# Patient Record
Sex: Male | Born: 1940 | Race: White | Hispanic: No | Marital: Married | ZIP: L1C | Smoking: Never smoker
Health system: Southern US, Community
[De-identification: ages and names within clinical notes are randomized; demographics above are authoritative.]

## PROBLEM LIST (undated history)

## (undated) DIAGNOSIS — G4730 Sleep apnea, unspecified: Secondary | ICD-10-CM

## (undated) DIAGNOSIS — I700 Atherosclerosis of aorta: Secondary | ICD-10-CM

## (undated) DIAGNOSIS — J8410 Pulmonary fibrosis, unspecified: Secondary | ICD-10-CM

## (undated) DIAGNOSIS — K219 Gastro-esophageal reflux disease without esophagitis: Secondary | ICD-10-CM

## (undated) DIAGNOSIS — I639 Cerebral infarction, unspecified: Secondary | ICD-10-CM

## (undated) DIAGNOSIS — I10 Essential (primary) hypertension: Secondary | ICD-10-CM

## (undated) DIAGNOSIS — M109 Gout, unspecified: Secondary | ICD-10-CM

## (undated) HISTORY — DX: Cerebral infarction, unspecified: I63.9

## (undated) HISTORY — DX: Atherosclerosis of aorta: I70.0

## (undated) HISTORY — PX: HERNIA REPAIR: SHX51

---

## 2003-12-09 ENCOUNTER — Ambulatory Visit (HOSPITAL_COMMUNITY): Admission: RE | Admit: 2003-12-09 | Discharge: 2003-12-09 | Payer: Self-pay | Admitting: Internal Medicine

## 2007-01-02 ENCOUNTER — Encounter: Admission: RE | Admit: 2007-01-02 | Discharge: 2007-01-02 | Payer: Self-pay | Admitting: Internal Medicine

## 2007-07-01 ENCOUNTER — Encounter: Admission: RE | Admit: 2007-07-01 | Discharge: 2007-07-01 | Payer: Self-pay | Admitting: Gastroenterology

## 2011-09-17 DIAGNOSIS — G4730 Sleep apnea, unspecified: Secondary | ICD-10-CM

## 2011-09-17 HISTORY — DX: Sleep apnea, unspecified: G47.30

## 2012-07-22 ENCOUNTER — Ambulatory Visit (HOSPITAL_COMMUNITY)
Admission: RE | Admit: 2012-07-22 | Discharge: 2012-07-22 | Disposition: A | Discharge status: Home / Self Care | Payer: Medicare Other | Source: Ambulatory Visit | Attending: Allergy and Immunology | Admitting: Allergy and Immunology

## 2012-07-22 DIAGNOSIS — I10 Essential (primary) hypertension: Secondary | ICD-10-CM | POA: Insufficient documentation

## 2012-07-22 DIAGNOSIS — I359 Nonrheumatic aortic valve disorder, unspecified: Secondary | ICD-10-CM

## 2012-07-22 DIAGNOSIS — R011 Cardiac murmur, unspecified: Secondary | ICD-10-CM | POA: Insufficient documentation

## 2012-07-22 NOTE — Progress Notes (Signed)
  Echocardiogram 2D Echocardiogram has been performed.  Thomas Bond 07/22/2012, 10:10 AM

## 2013-08-17 ENCOUNTER — Ambulatory Visit
Admission: RE | Admit: 2013-08-17 | Discharge: 2013-08-17 | Disposition: A | Discharge status: Home / Self Care | Payer: Medicare Other | Source: Ambulatory Visit | Attending: Allergy and Immunology | Admitting: Allergy and Immunology

## 2013-08-17 ENCOUNTER — Other Ambulatory Visit (HOSPITAL_COMMUNITY): Payer: Self-pay | Admitting: Allergy and Immunology

## 2013-08-17 ENCOUNTER — Other Ambulatory Visit: Payer: Self-pay | Admitting: Allergy and Immunology

## 2013-08-17 DIAGNOSIS — R05 Cough: Secondary | ICD-10-CM

## 2013-08-17 DIAGNOSIS — I359 Nonrheumatic aortic valve disorder, unspecified: Secondary | ICD-10-CM

## 2013-08-18 ENCOUNTER — Ambulatory Visit (HOSPITAL_COMMUNITY)
Admission: RE | Admit: 2013-08-18 | Discharge: 2013-08-18 | Disposition: A | Discharge status: Home / Self Care | Payer: Medicare Other | Source: Ambulatory Visit | Attending: Allergy and Immunology | Admitting: Allergy and Immunology

## 2013-08-18 DIAGNOSIS — I359 Nonrheumatic aortic valve disorder, unspecified: Secondary | ICD-10-CM | POA: Insufficient documentation

## 2013-08-18 NOTE — Progress Notes (Signed)
  Echocardiogram 2D Echocardiogram has been performed.  Thomas Bond FRANCES 08/18/2013, 4:02 PM

## 2014-08-18 ENCOUNTER — Other Ambulatory Visit: Payer: Self-pay | Admitting: Gastroenterology

## 2014-08-18 ENCOUNTER — Other Ambulatory Visit (HOSPITAL_COMMUNITY): Payer: Self-pay | Admitting: Gastroenterology

## 2014-08-18 DIAGNOSIS — J690 Pneumonitis due to inhalation of food and vomit: Secondary | ICD-10-CM

## 2014-09-26 ENCOUNTER — Encounter (HOSPITAL_COMMUNITY): Payer: Self-pay | Admitting: *Deleted

## 2014-09-27 ENCOUNTER — Other Ambulatory Visit: Payer: Self-pay | Admitting: Gastroenterology

## 2014-10-08 NOTE — Anesthesia Preprocedure Evaluation (Addendum)
Anesthesia Evaluation  Patient identified by MRN, date of birth, ID band Patient awake    Reviewed: Allergy & Precautions, NPO status , Patient's Chart, lab work & pertinent test results, reviewed documented beta blocker date and time   Airway Mallampati: II   Neck ROM: Full    Dental  (+) Teeth Intact, Dental Advisory Given   Pulmonary sleep apnea ,  breath sounds clear to auscultation        Cardiovascular hypertension, Pt. on medications Rhythm:Regular  2014 ECHO EF 65% Aortic valve 1.2 cm gradient 24mmhg   Neuro/Psych negative neurological ROS  negative psych ROS   GI/Hepatic Neg liver ROS, GERD-  Medicated,Hx colon polyp   Endo/Other  negative endocrine ROS  Renal/GU negative Renal ROS     Musculoskeletal   Abdominal (+)  Abdomen: soft.    Peds  Hematology negative hematology ROS (+)   Anesthesia Other Findings   Reproductive/Obstetrics                            Anesthesia Physical Anesthesia Plan  ASA: II  Anesthesia Plan: MAC   Post-op Pain Management:    Induction: Intravenous  Airway Management Planned: Nasal Cannula  Additional Equipment:   Intra-op Plan:   Post-operative Plan:   Informed Consent: I have reviewed the patients History and Physical, chart, labs and discussed the procedure including the risks, benefits and alternatives for the proposed anesthesia with the patient or authorized representative who has indicated his/her understanding and acceptance.     Plan Discussed with:   Anesthesia Plan Comments:         Anesthesia Quick Evaluation

## 2014-10-10 ENCOUNTER — Encounter (HOSPITAL_COMMUNITY)
Admission: RE | Disposition: A | Discharge status: Home / Self Care | Payer: Self-pay | Source: Ambulatory Visit | Attending: Gastroenterology

## 2014-10-10 ENCOUNTER — Ambulatory Visit (HOSPITAL_COMMUNITY): Payer: Commercial Managed Care - HMO | Admitting: Anesthesiology

## 2014-10-10 ENCOUNTER — Encounter (HOSPITAL_COMMUNITY): Payer: Self-pay | Admitting: Anesthesiology

## 2014-10-10 ENCOUNTER — Ambulatory Visit (HOSPITAL_COMMUNITY)
Admission: RE | Admit: 2014-10-10 | Discharge: 2014-10-10 | Disposition: A | Discharge status: Home / Self Care | Payer: Commercial Managed Care - HMO | Source: Ambulatory Visit | Attending: Gastroenterology | Admitting: Gastroenterology

## 2014-10-10 DIAGNOSIS — E780 Pure hypercholesterolemia: Secondary | ICD-10-CM | POA: Diagnosis not present

## 2014-10-10 DIAGNOSIS — Z86010 Personal history of colonic polyps: Secondary | ICD-10-CM | POA: Insufficient documentation

## 2014-10-10 DIAGNOSIS — K219 Gastro-esophageal reflux disease without esophagitis: Secondary | ICD-10-CM | POA: Insufficient documentation

## 2014-10-10 DIAGNOSIS — K635 Polyp of colon: Secondary | ICD-10-CM | POA: Diagnosis not present

## 2014-10-10 DIAGNOSIS — Z7982 Long term (current) use of aspirin: Secondary | ICD-10-CM | POA: Diagnosis not present

## 2014-10-10 DIAGNOSIS — G4730 Sleep apnea, unspecified: Secondary | ICD-10-CM | POA: Insufficient documentation

## 2014-10-10 DIAGNOSIS — D120 Benign neoplasm of cecum: Secondary | ICD-10-CM | POA: Insufficient documentation

## 2014-10-10 DIAGNOSIS — D125 Benign neoplasm of sigmoid colon: Secondary | ICD-10-CM | POA: Diagnosis not present

## 2014-10-10 DIAGNOSIS — I10 Essential (primary) hypertension: Secondary | ICD-10-CM | POA: Insufficient documentation

## 2014-10-10 DIAGNOSIS — M109 Gout, unspecified: Secondary | ICD-10-CM | POA: Insufficient documentation

## 2014-10-10 DIAGNOSIS — K5730 Diverticulosis of large intestine without perforation or abscess without bleeding: Secondary | ICD-10-CM | POA: Diagnosis not present

## 2014-10-10 DIAGNOSIS — Z1211 Encounter for screening for malignant neoplasm of colon: Secondary | ICD-10-CM | POA: Diagnosis not present

## 2014-10-10 HISTORY — DX: Sleep apnea, unspecified: G47.30

## 2014-10-10 HISTORY — PX: COLONOSCOPY WITH PROPOFOL: SHX5780

## 2014-10-10 HISTORY — DX: Essential (primary) hypertension: I10

## 2014-10-10 HISTORY — DX: Gastro-esophageal reflux disease without esophagitis: K21.9

## 2014-10-10 HISTORY — DX: Gout, unspecified: M10.9

## 2014-10-10 SURGERY — COLONOSCOPY WITH PROPOFOL
Anesthesia: Monitor Anesthesia Care

## 2014-10-10 MED ORDER — ONDANSETRON HCL 4 MG/2ML IJ SOLN
INTRAMUSCULAR | Status: DC | PRN
Start: 2014-10-10 — End: 2014-10-10
  Administered 2014-10-10: 4 mg via INTRAVENOUS

## 2014-10-10 MED ORDER — LACTATED RINGERS IV SOLN
INTRAVENOUS | Status: DC
Start: 2014-10-10 — End: 2014-10-10

## 2014-10-10 MED ORDER — PHENYLEPHRINE HCL 10 MG/ML IJ SOLN
INTRAMUSCULAR | Status: DC | PRN
  Administered 2014-10-10 (×3): 40 ug via INTRAVENOUS

## 2014-10-10 MED ORDER — KETAMINE HCL 10 MG/ML IJ SOLN
INTRAMUSCULAR | Status: DC | PRN
  Administered 2014-10-10: 20 mg via INTRAVENOUS

## 2014-10-10 MED ORDER — SODIUM CHLORIDE 0.9 % IV SOLN: INTRAVENOUS | Status: DC

## 2014-10-10 MED ORDER — PROMETHAZINE HCL 25 MG/ML IJ SOLN: 6.2500 mg | INTRAMUSCULAR | Status: DC | PRN

## 2014-10-10 MED ORDER — PROPOFOL 10 MG/ML IV BOLUS
INTRAVENOUS | Status: AC
  Filled 2014-10-10: qty 20

## 2014-10-10 MED ORDER — ONDANSETRON HCL 4 MG/2ML IJ SOLN
INTRAMUSCULAR | Status: AC
  Filled 2014-10-10: qty 2

## 2014-10-10 MED ORDER — PROPOFOL INFUSION 10 MG/ML OPTIME
INTRAVENOUS | Status: DC | PRN
  Administered 2014-10-10: 300 ug/kg/min via INTRAVENOUS

## 2014-10-10 MED ORDER — FENTANYL CITRATE 0.05 MG/ML IJ SOLN: 25.0000 ug | INTRAMUSCULAR | Status: DC | PRN

## 2014-10-10 MED ORDER — LACTATED RINGERS IV SOLN
INTRAVENOUS | Status: DC | PRN
  Administered 2014-10-10: 08:00:00 via INTRAVENOUS

## 2014-10-10 MED ORDER — MEPERIDINE HCL 100 MG/ML IJ SOLN: 6.2500 mg | INTRAMUSCULAR | Status: DC | PRN

## 2014-10-10 SURGICAL SUPPLY — 21 items

## 2014-10-10 NOTE — Op Note (Signed)
Procedure: Surveillance colonoscopy. Colonoscopy with removal of a 15 mm tubular adenomatous ascending colon polyp performed on 02/20/2011. Normal esophagogastroduodenoscopy performed on 02/26/2011.  Endoscopist: Earle Gell  Premedication: Propofol administered by anesthesia  Procedure: The patient was placed in the left lateral decubitus position. Anal inspection and digital rectal exam were normal. The Pentax pediatric colonoscope was introduced into the rectum and advanced to the cecum. A normal-appearing appendiceal orifice was identified. A normal-appearing ileocecal valve was identified. Colonic preparation for the exam today was good. Withdrawal time was 18 minutes  Rectum. Normal. Retroflexed view of the distal rectum normal  Sigmoid colon. A 3 mm sessile polyp was removed from the mid sigmoid colon with the cold biopsy forceps. Left colonic diverticulosis.  Descending colon. A 3 mm sessile polyp was removed from the proximal descending colon with the cold biopsy forceps.  Splenic flexure. Normal  Transverse colon. Normal  Hepatic flexure. A 5 mm sessile polyp was removed with the cold snare.  Ascending colon. Normal  Cecum and ileocecal valve. A 3 mm sessile polyp was removed from the proximal cecum with cold biopsy forceps  Assessment:  #1. Left colonic diverticulosis  #2. A 3 mm sessile polyp was removed from the cecum, a 5 mm sessile polyp was removed from the hepatic flexure, a 3 mm sessile polyp was removed from the descending colon, a 3 mm sessile polyp was removed from the sigmoid colon.

## 2014-10-10 NOTE — Anesthesia Postprocedure Evaluation (Signed)
  Anesthesia Post-op Note  Patient: Thomas Bond  Procedure(s) Performed: Procedure(s): COLONOSCOPY WITH PROPOFOL (N/A)  Patient Location: PACU  Anesthesia Type:MAC  Level of Consciousness: awake and alert   Airway and Oxygen Therapy: Patient Spontanous Breathing and Patient connected to nasal cannula oxygen  Post-op Pain: none  Post-op Assessment: Post-op Vital signs reviewed, Patient's Cardiovascular Status Stable and PATIENT'S CARDIOVASCULAR STATUS UNSTABLE  Post-op Vital Signs: Reviewed and stable  Last Vitals:  Filed Vitals:   10/10/14 0954  BP: 78/34  Pulse:   Temp: 36.8 C  Resp:     Complications: No apparent anesthesia complications and Patient re-intubated

## 2014-10-10 NOTE — Transfer of Care (Signed)
Immediate Anesthesia Transfer of Care Note  Patient: Thomas Bond  Procedure(s) Performed: Procedure(s): COLONOSCOPY WITH PROPOFOL (N/A)  Patient Location: PACU  Anesthesia Type:MAC  Level of Consciousness: awake, alert  and oriented  Airway & Oxygen Therapy: Patient Spontanous Breathing and Patient connected to face mask oxygen  Post-op Assessment: Report given to PACU RN  Post vital signs: Reviewed and stable  Complications: No apparent anesthesia complications

## 2014-10-10 NOTE — H&P (Addendum)
  Procedure: Surveillance colonoscopy. Colonoscopy with removal of a 15 mm tubular adenomatous ascending colon polyp performed on 02/20/2011. Normal esophagogastroduodenoscopy performed on 02/26/2011.  History: The patient is a 74 year old male born September 20, 1940. He is scheduled to undergo a surveillance colonoscopy with polypectomy to prevent colon cancer.  Medication allergies: Indomethacin and allopurinol  Past medical history: Hypertension. Gout. Hypercholesterolemia. Seasonal allergies. Gastroesophageal reflux. Radical arthritis. Inguinal hernia repair bilaterally.  Exam: The patient is alert and lying comfortably on the endoscopy stretcher. Abdomen is soft and nontender to palpation. Cardiac exam reveals a regular rhythm. Lungs are clear to auscultation.  Plan: Proceed with surveillance colonoscopy

## 2014-10-10 NOTE — Discharge Instructions (Signed)

## 2014-10-11 ENCOUNTER — Encounter (HOSPITAL_COMMUNITY): Payer: Self-pay | Admitting: Gastroenterology

## 2014-10-19 ENCOUNTER — Ambulatory Visit (HOSPITAL_COMMUNITY)
Admission: RE | Admit: 2014-10-19 | Discharge: 2014-10-19 | Disposition: A | Discharge status: Home / Self Care | Payer: Commercial Managed Care - HMO | Source: Ambulatory Visit | Attending: Gastroenterology | Admitting: Gastroenterology

## 2014-10-19 DIAGNOSIS — R1312 Dysphagia, oropharyngeal phase: Secondary | ICD-10-CM | POA: Insufficient documentation

## 2014-10-19 DIAGNOSIS — E785 Hyperlipidemia, unspecified: Secondary | ICD-10-CM | POA: Insufficient documentation

## 2014-10-19 DIAGNOSIS — R1319 Other dysphagia: Secondary | ICD-10-CM | POA: Insufficient documentation

## 2014-10-19 DIAGNOSIS — K219 Gastro-esophageal reflux disease without esophagitis: Secondary | ICD-10-CM | POA: Insufficient documentation

## 2014-10-19 DIAGNOSIS — I10 Essential (primary) hypertension: Secondary | ICD-10-CM | POA: Insufficient documentation

## 2014-10-19 DIAGNOSIS — G4730 Sleep apnea, unspecified: Secondary | ICD-10-CM | POA: Diagnosis not present

## 2014-10-19 DIAGNOSIS — L4050 Arthropathic psoriasis, unspecified: Secondary | ICD-10-CM | POA: Insufficient documentation

## 2014-10-19 DIAGNOSIS — R1310 Dysphagia, unspecified: Secondary | ICD-10-CM | POA: Diagnosis not present

## 2014-10-19 DIAGNOSIS — J690 Pneumonitis due to inhalation of food and vomit: Secondary | ICD-10-CM

## 2014-10-19 NOTE — Procedures (Signed)
Objective Swallowing Evaluation: Modified Barium Swallowing Study  Patient Details  Name: Thomas Bond MRN: [id number] Date of Birth: 07/22/1941  Today's Date: 10/19/2014 Time: SLP Start Time (ACUTE ONLY): 1310-SLP Stop Time (ACUTE ONLY): 1355 SLP Time Calculation (min) (ACUTE ONLY): 45 min  Past Medical History:  Past Medical History  Diagnosis Date  . Hypertension   . Sleep apnea 2013    told has sleep apnea afer sleep study, no follow up study done  . GERD (gastroesophageal reflux disease)   . Gout    Past Surgical History:  Past Surgical History  Procedure Laterality Date  . Hernia repair Bilateral 1984 and 1998  . Colonoscopy with propofol N/A 10/10/2014    Procedure: COLONOSCOPY WITH PROPOFOL;  Surgeon: Garlan Fair, MD;  Location: [facility];  Service: Endoscopy;  Laterality: N/A;   HPI:  HPI: 74 yo male referred by Dr Wynetta Emery for MBS due to concerns pt may be aspirating.  PMH + for esophageal reflux, HTN, seasonal allergies, gout, HLD, psoriatic arthritis, anemia.  PSH + for inguinal hernia repair bilateral, colonscopy.  Pt does take omeprazole = twice a day and reports he has for sometime.  Pt states issues with hoarseness and choking on foods (causing him to cough) has been present notably for a few months and has not worsened in intensity or frequency.  He has not required heimlich manuver, lost weigh unintentionally or had pnas per his statement. Pt reports choking episodes occur more on food than drink.      No Data Recorded  Assessment / Plan / Recommendation CHL IP CLINICAL IMPRESSIONS 10/19/2014  Dysphagia Diagnosis Mild oral phase dysphagia;Mild pharyngeal phase dysphagia;Mild cervical esophageal phase dysphagia  Clinical impression Minimal oral, mild pharyngo-cervical esophageal dysphagia with suspected component of structural contribution.  Appearance of cervical spine protruding into pharynx at epiglottic deflection level and near UES likely contributes to  pharyngeal stasis (both vallecular and pyriform sinus)- of which pt does not consistently sense.   Liquid swallows and cued dry swallows assisted to decrease stasis.  Of note, pt chronically throat clearing throughout evaluation - ? if this could contribute to his hoarseness. Barium tablet given with thin precariously lodged at UES - with pt reporting sensation (but talking at same time)  - consumption of pudding aided transition into esophagus.    Per radiologist, pt did aspirate thin barium while taking pill- no coughing noted during MBS.  Advised pt to avoid mixed consistencies.  Recommend pt follow compensation strategies to mitigate his dysphagia symptoms.  Using live monitor, written information, educated completed.    Thanks for this consult.        CHL IP TREATMENT RECOMMENDATION 10/19/2014  Treatment Plan Recommendations No treatment recommended at this time     CHL IP DIET RECOMMENDATION 10/19/2014  Diet Recommendations Regular;Thin liquid  Liquid Administration via Cup;Straw  Medication Administration Whole meds with puree  Compensations Slow rate;Small sips/bites;Follow solids with liquid  Postural Changes and/or Swallow Maneuvers Upright 30-60 min after meal;Seated upright 90 degrees     CHL IP OTHER RECOMMENDATIONS 10/19/2014  Recommended Consults (None)  Oral Care Recommendations Oral care BID  Other Recommendations (None)     CHL IP FOLLOW UP RECOMMENDATIONS 10/19/2014  Follow up Recommendations None            CHL IP REASON FOR REFERRAL 10/19/2014  Reason for Referral Objectively evaluate swallowing function     CHL IP ORAL PHASE 10/19/2014  Lips (None)  Tongue (None)  Mucous membranes (None)  Nutritional status (None)  Other (None)  Oxygen therapy (None)  Oral Phase Impaired  Oral - Pudding Teaspoon (None)  Oral - Pudding Cup (None)  Oral - Honey Teaspoon (None)  Oral - Honey Cup (None)  Oral - Honey Syringe (None)  Oral - Nectar Teaspoon (None)  Oral - Nectar Cup  Lingual/palatal residue  Oral - Nectar Straw (None)  Oral - Nectar Syringe (None)  Oral - Ice Chips (None)  Oral - Thin Teaspoon (None)  Oral - Thin Cup Lingual/palatal residue  Oral - Thin Straw Lingual/palatal residue  Oral - Thin Syringe (None)  Oral - Puree WFL  Oral - Mechanical Soft (None)  Oral - Regular WFL  Oral - Multi-consistency (None)  Oral - Pill WFL  Oral Phase - Comment oral residuals noted without sensation, cued swallow effective to decrease residuals      CHL IP PHARYNGEAL PHASE 10/19/2014  Pharyngeal Phase Impaired  Pharyngeal - Pudding Teaspoon (None)  Penetration/Aspiration details (pudding teaspoon) (None)  Pharyngeal - Pudding Cup (None)  Penetration/Aspiration details (pudding cup) (None)  Pharyngeal - Honey Teaspoon (None)  Penetration/Aspiration details (honey teaspoon) (None)  Pharyngeal - Honey Cup (None)  Penetration/Aspiration details (honey cup) (None)  Pharyngeal - Honey Syringe (None)  Penetration/Aspiration details (honey syringe) (None)  Pharyngeal - Nectar Teaspoon (None)  Penetration/Aspiration details (nectar teaspoon) (None)  Pharyngeal - Nectar Cup Reduced epiglottic inversion;Pharyngeal residue - valleculae;Pharyngeal residue - pyriform sinuses;Pharyngeal residue - cp segment  Penetration/Aspiration details (nectar cup) (None)  Pharyngeal - Nectar Straw (None)  Penetration/Aspiration details (nectar straw) (None)  Pharyngeal - Nectar Syringe (None)  Penetration/Aspiration details (nectar syringe) (None)  Pharyngeal - Ice Chips (None)  Penetration/Aspiration details (ice chips) (None)  Pharyngeal - Thin Teaspoon (None)  Penetration/Aspiration details (thin teaspoon) (None)  Pharyngeal - Thin Cup Reduced epiglottic inversion;Pharyngeal residue - valleculae;Pharyngeal residue - pyriform sinuses;Pharyngeal residue - cp segment  Penetration/Aspiration details (thin cup) (None)  Pharyngeal - Thin Straw Reduced epiglottic inversion;Pharyngeal  residue - valleculae;Pharyngeal residue - pyriform sinuses;Pharyngeal residue - cp segment  Penetration/Aspiration details (thin straw) (None)  Pharyngeal - Thin Syringe (None)  Penetration/Aspiration details (thin syringe') (None)  Pharyngeal - Puree Reduced epiglottic inversion;Pharyngeal residue - valleculae;Pharyngeal residue - pyriform sinuses;Pharyngeal residue - cp segment  Penetration/Aspiration details (puree) (None)  Pharyngeal - Mechanical Soft (None)  Penetration/Aspiration details (mechanical soft) (None)  Pharyngeal - Regular Reduced epiglottic inversion;Pharyngeal residue - valleculae;Pharyngeal residue - pyriform sinuses;Pharyngeal residue - cp segment  Penetration/Aspiration details (regular) (None)  Pharyngeal - Multi-consistency (None)  Penetration/Aspiration details (multi-consistency) (None)  Pharyngeal - Pill Reduced epiglottic inversion;Pharyngeal residue - cp segment;Pharyngeal residue - pyriform sinuses  Penetration/Aspiration details (pill) (None)  Pharyngeal Comment liquid swallows decrease pharyngeal residuals, dry swallows helpful but did not fully eliminate residuals, slp instructed pt to "hock", expectorate to try to clear vallecular residuals but this was not effective     CHL IP CERVICAL ESOPHAGEAL PHASE 10/19/2014  Cervical Esophageal Phase Impaired  Pudding Teaspoon (None)  Pudding Cup (None)  Honey Teaspoon (None)  Honey Cup (None)  Honey Syringe (None)  Nectar Teaspoon (None)  Nectar Cup (None)  Nectar Straw (None)  Nectar Syringe (None)  Thin Teaspoon (None)  Thin Cup (None)  Thin Straw (None)  Thin Syringe (None)  Cervical Esophageal Comment barium tablet taken with thin lodged near UES - with pt sensation - use of pudding faciliated clearance into esophagus, appearance of bony impingement of cervical spine likely contributing to residuals    CHL IP GO 10/19/2014  Functional Assessment  Tool Used mbs, clinical judgement  Functional Limitations  Swallowing  Swallow Current Status [phone]) CI  Swallow Goal Status (V6153) CI  Swallow Discharge Status (P9432) CI  Motor Speech Current Status [phone]) (None)  Motor Speech Goal Status (W9295) (None)  Motor Speech Goal Status (F4734) (None)  Spoken Language Comprehension Current Status [phone]) (None)  Spoken Language Comprehension Goal Status (U4383) (None)  Spoken Language Comprehension Discharge Status [phone] (None)  Spoken Language Expression Current Status [phone] (None)  Spoken Language Expression Goal Status [phone]) (None)  Spoken Language Expression Discharge Status [phone]) (None)  Attention Current Status (T2481) (None)  Attention Goal Status (Y5909) (None)  Attention Discharge Status [phone]) (None)  Memory Current Status (K2446) (None)  Memory Goal Status (X5072) (None)  Memory Discharge Status (U5750) (None)  Voice Current Status (N1833) (None)  Voice Goal Status (P8251) (None)  Voice Discharge Status (G9842) (None)  Other Speech-Language Pathology Functional Limitation [phone]) (None)  Other Speech-Language Pathology Functional Limitation Goal Status (O1188) (None)  Other Speech-Language Pathology Functional Limitation Discharge Status [phone]) (None)           Luanna Salk, [facility] SLP [phone]

## 2014-11-01 DIAGNOSIS — M1A9XX0 Chronic gout, unspecified, without tophus (tophi): Secondary | ICD-10-CM | POA: Diagnosis not present

## 2014-11-01 DIAGNOSIS — Z125 Encounter for screening for malignant neoplasm of prostate: Secondary | ICD-10-CM | POA: Diagnosis not present

## 2014-11-01 DIAGNOSIS — Z0000 Encounter for general adult medical examination without abnormal findings: Secondary | ICD-10-CM | POA: Diagnosis not present

## 2014-11-01 DIAGNOSIS — M8580 Other specified disorders of bone density and structure, unspecified site: Secondary | ICD-10-CM | POA: Diagnosis not present

## 2014-11-01 DIAGNOSIS — Z23 Encounter for immunization: Secondary | ICD-10-CM | POA: Diagnosis not present

## 2014-11-01 DIAGNOSIS — I10 Essential (primary) hypertension: Secondary | ICD-10-CM | POA: Diagnosis not present

## 2014-11-01 DIAGNOSIS — R7301 Impaired fasting glucose: Secondary | ICD-10-CM | POA: Diagnosis not present

## 2014-11-08 DIAGNOSIS — M8580 Other specified disorders of bone density and structure, unspecified site: Secondary | ICD-10-CM | POA: Diagnosis not present

## 2014-11-08 DIAGNOSIS — R7301 Impaired fasting glucose: Secondary | ICD-10-CM | POA: Diagnosis not present

## 2014-11-08 DIAGNOSIS — K219 Gastro-esophageal reflux disease without esophagitis: Secondary | ICD-10-CM | POA: Diagnosis not present

## 2014-11-08 DIAGNOSIS — J309 Allergic rhinitis, unspecified: Secondary | ICD-10-CM | POA: Diagnosis not present

## 2014-11-08 DIAGNOSIS — G4733 Obstructive sleep apnea (adult) (pediatric): Secondary | ICD-10-CM | POA: Diagnosis not present

## 2014-11-08 DIAGNOSIS — I10 Essential (primary) hypertension: Secondary | ICD-10-CM | POA: Diagnosis not present

## 2014-11-08 DIAGNOSIS — N182 Chronic kidney disease, stage 2 (mild): Secondary | ICD-10-CM | POA: Diagnosis not present

## 2015-03-29 DIAGNOSIS — R220 Localized swelling, mass and lump, head: Secondary | ICD-10-CM | POA: Diagnosis not present

## 2015-04-13 DIAGNOSIS — R7301 Impaired fasting glucose: Secondary | ICD-10-CM | POA: Diagnosis not present

## 2015-04-13 DIAGNOSIS — I10 Essential (primary) hypertension: Secondary | ICD-10-CM | POA: Diagnosis not present

## 2015-04-18 DIAGNOSIS — M109 Gout, unspecified: Secondary | ICD-10-CM | POA: Diagnosis not present

## 2015-04-18 DIAGNOSIS — I129 Hypertensive chronic kidney disease with stage 1 through stage 4 chronic kidney disease, or unspecified chronic kidney disease: Secondary | ICD-10-CM | POA: Diagnosis not present

## 2015-04-18 DIAGNOSIS — N182 Chronic kidney disease, stage 2 (mild): Secondary | ICD-10-CM | POA: Diagnosis not present

## 2015-04-18 DIAGNOSIS — G4733 Obstructive sleep apnea (adult) (pediatric): Secondary | ICD-10-CM | POA: Diagnosis not present

## 2015-04-18 DIAGNOSIS — Z125 Encounter for screening for malignant neoplasm of prostate: Secondary | ICD-10-CM | POA: Diagnosis not present

## 2015-04-18 DIAGNOSIS — R739 Hyperglycemia, unspecified: Secondary | ICD-10-CM | POA: Diagnosis not present

## 2015-05-02 ENCOUNTER — Other Ambulatory Visit (HOSPITAL_COMMUNITY)
Admission: RE | Admit: 2015-05-02 | Discharge: 2015-05-02 | Disposition: A | Discharge status: Home / Self Care | Payer: Commercial Managed Care - HMO | Source: Ambulatory Visit | Attending: Otolaryngology | Admitting: Otolaryngology

## 2015-05-02 ENCOUNTER — Other Ambulatory Visit: Payer: Self-pay | Admitting: Otolaryngology

## 2015-05-02 DIAGNOSIS — D110 Benign neoplasm of parotid gland: Secondary | ICD-10-CM | POA: Diagnosis not present

## 2015-05-02 DIAGNOSIS — K219 Gastro-esophageal reflux disease without esophagitis: Secondary | ICD-10-CM | POA: Diagnosis not present

## 2015-05-02 DIAGNOSIS — R1319 Other dysphagia: Secondary | ICD-10-CM | POA: Diagnosis not present

## 2015-05-02 DIAGNOSIS — H6123 Impacted cerumen, bilateral: Secondary | ICD-10-CM | POA: Diagnosis not present

## 2015-05-02 DIAGNOSIS — D37039 Neoplasm of uncertain behavior of the major salivary glands, unspecified: Secondary | ICD-10-CM | POA: Diagnosis not present

## 2015-09-04 DIAGNOSIS — L299 Pruritus, unspecified: Secondary | ICD-10-CM | POA: Diagnosis not present

## 2015-09-04 DIAGNOSIS — L308 Other specified dermatitis: Secondary | ICD-10-CM | POA: Diagnosis not present

## 2015-09-04 DIAGNOSIS — L218 Other seborrheic dermatitis: Secondary | ICD-10-CM | POA: Diagnosis not present

## 2015-09-26 DIAGNOSIS — N390 Urinary tract infection, site not specified: Secondary | ICD-10-CM | POA: Diagnosis not present

## 2015-11-06 DIAGNOSIS — M109 Gout, unspecified: Secondary | ICD-10-CM | POA: Diagnosis not present

## 2015-11-06 DIAGNOSIS — Z125 Encounter for screening for malignant neoplasm of prostate: Secondary | ICD-10-CM | POA: Diagnosis not present

## 2015-11-06 DIAGNOSIS — Z0000 Encounter for general adult medical examination without abnormal findings: Secondary | ICD-10-CM | POA: Diagnosis not present

## 2015-11-06 DIAGNOSIS — R739 Hyperglycemia, unspecified: Secondary | ICD-10-CM | POA: Diagnosis not present

## 2015-11-06 DIAGNOSIS — I129 Hypertensive chronic kidney disease with stage 1 through stage 4 chronic kidney disease, or unspecified chronic kidney disease: Secondary | ICD-10-CM | POA: Diagnosis not present

## 2015-11-06 DIAGNOSIS — Z23 Encounter for immunization: Secondary | ICD-10-CM | POA: Diagnosis not present

## 2015-11-06 DIAGNOSIS — Z1389 Encounter for screening for other disorder: Secondary | ICD-10-CM | POA: Diagnosis not present

## 2015-11-13 DIAGNOSIS — N182 Chronic kidney disease, stage 2 (mild): Secondary | ICD-10-CM | POA: Diagnosis not present

## 2015-11-13 DIAGNOSIS — M109 Gout, unspecified: Secondary | ICD-10-CM | POA: Diagnosis not present

## 2015-11-13 DIAGNOSIS — I129 Hypertensive chronic kidney disease with stage 1 through stage 4 chronic kidney disease, or unspecified chronic kidney disease: Secondary | ICD-10-CM | POA: Diagnosis not present

## 2015-11-13 DIAGNOSIS — R7309 Other abnormal glucose: Secondary | ICD-10-CM | POA: Diagnosis not present

## 2015-11-27 DIAGNOSIS — R0602 Shortness of breath: Secondary | ICD-10-CM | POA: Diagnosis not present

## 2015-11-27 DIAGNOSIS — R9431 Abnormal electrocardiogram [ECG] [EKG]: Secondary | ICD-10-CM | POA: Diagnosis not present

## 2015-12-11 DIAGNOSIS — I129 Hypertensive chronic kidney disease with stage 1 through stage 4 chronic kidney disease, or unspecified chronic kidney disease: Secondary | ICD-10-CM | POA: Diagnosis not present

## 2016-01-26 DIAGNOSIS — L309 Dermatitis, unspecified: Secondary | ICD-10-CM | POA: Diagnosis not present

## 2016-05-22 ENCOUNTER — Emergency Department (HOSPITAL_COMMUNITY): Payer: Commercial Managed Care - HMO

## 2016-05-22 ENCOUNTER — Encounter (HOSPITAL_COMMUNITY): Payer: Self-pay | Admitting: *Deleted

## 2016-05-22 ENCOUNTER — Inpatient Hospital Stay (HOSPITAL_COMMUNITY)
Admission: EM | Admit: 2016-05-22 | Discharge: 2016-05-23 | DRG: 066 | Disposition: A | Discharge status: Home / Self Care | Payer: Commercial Managed Care - HMO | Attending: Internal Medicine | Admitting: Internal Medicine

## 2016-05-22 DIAGNOSIS — N182 Chronic kidney disease, stage 2 (mild): Secondary | ICD-10-CM | POA: Diagnosis present

## 2016-05-22 DIAGNOSIS — E785 Hyperlipidemia, unspecified: Secondary | ICD-10-CM | POA: Diagnosis present

## 2016-05-22 DIAGNOSIS — G4733 Obstructive sleep apnea (adult) (pediatric): Secondary | ICD-10-CM | POA: Diagnosis not present

## 2016-05-22 DIAGNOSIS — E876 Hypokalemia: Secondary | ICD-10-CM | POA: Diagnosis not present

## 2016-05-22 DIAGNOSIS — Z8249 Family history of ischemic heart disease and other diseases of the circulatory system: Secondary | ICD-10-CM

## 2016-05-22 DIAGNOSIS — I638 Other cerebral infarction: Principal | ICD-10-CM | POA: Diagnosis present

## 2016-05-22 DIAGNOSIS — I6629 Occlusion and stenosis of unspecified posterior cerebral artery: Secondary | ICD-10-CM | POA: Diagnosis not present

## 2016-05-22 DIAGNOSIS — M109 Gout, unspecified: Secondary | ICD-10-CM | POA: Diagnosis present

## 2016-05-22 DIAGNOSIS — I6350 Cerebral infarction due to unspecified occlusion or stenosis of unspecified cerebral artery: Secondary | ICD-10-CM

## 2016-05-22 DIAGNOSIS — Z7982 Long term (current) use of aspirin: Secondary | ICD-10-CM | POA: Diagnosis not present

## 2016-05-22 DIAGNOSIS — R471 Dysarthria and anarthria: Secondary | ICD-10-CM | POA: Diagnosis present

## 2016-05-22 DIAGNOSIS — R29702 NIHSS score 2: Secondary | ICD-10-CM | POA: Diagnosis not present

## 2016-05-22 DIAGNOSIS — Z79899 Other long term (current) drug therapy: Secondary | ICD-10-CM

## 2016-05-22 DIAGNOSIS — I129 Hypertensive chronic kidney disease with stage 1 through stage 4 chronic kidney disease, or unspecified chronic kidney disease: Secondary | ICD-10-CM | POA: Diagnosis not present

## 2016-05-22 DIAGNOSIS — K219 Gastro-esophageal reflux disease without esophagitis: Secondary | ICD-10-CM | POA: Diagnosis not present

## 2016-05-22 DIAGNOSIS — R4781 Slurred speech: Secondary | ICD-10-CM | POA: Diagnosis not present

## 2016-05-22 DIAGNOSIS — I639 Cerebral infarction, unspecified: Secondary | ICD-10-CM | POA: Diagnosis not present

## 2016-05-22 LAB — I-STAT TROPONIN, ED: Troponin i, poc: 0 ng/mL (ref 0.00–0.08)

## 2016-05-22 LAB — I-STAT CHEM 8, ED
BUN: 14 mg/dL (ref 6–20)
CALCIUM ION: 1.12 mmol/L — AB (ref 1.15–1.40)
CHLORIDE: 100 mmol/L — AB (ref 101–111)
CREATININE: 1.2 mg/dL (ref 0.61–1.24)
Glucose, Bld: 94 mg/dL (ref 65–99)
HCT: 48 % (ref 39.0–52.0)
Hemoglobin: 16.3 g/dL (ref 13.0–17.0)
Potassium: 3.5 mmol/L (ref 3.5–5.1)
SODIUM: 140 mmol/L (ref 135–145)
TCO2: 27 mmol/L (ref 0–100)

## 2016-05-22 LAB — COMPREHENSIVE METABOLIC PANEL
ALT: 36 U/L (ref 17–63)
ANION GAP: 12 (ref 5–15)
AST: 45 U/L — ABNORMAL HIGH (ref 15–41)
Albumin: 4.8 g/dL (ref 3.5–5.0)
Alkaline Phosphatase: 54 U/L (ref 38–126)
BUN: 12 mg/dL (ref 6–20)
CHLORIDE: 100 mmol/L — AB (ref 101–111)
CO2: 25 mmol/L (ref 22–32)
Calcium: 9.5 mg/dL (ref 8.9–10.3)
Creatinine, Ser: 1.28 mg/dL — ABNORMAL HIGH (ref 0.61–1.24)
GFR calc non Af Amer: 52 mL/min — ABNORMAL LOW (ref >60)
Glucose, Bld: 95 mg/dL (ref 65–99)
Potassium: 3.4 mmol/L — ABNORMAL LOW (ref 3.5–5.1)
Sodium: 137 mmol/L (ref 135–145)
Total Bilirubin: 0.8 mg/dL (ref 0.3–1.2)
Total Protein: 7.9 g/dL (ref 6.5–8.1)

## 2016-05-22 LAB — CBG MONITORING, ED
GLUCOSE-CAPILLARY: 82 mg/dL (ref 65–99)
Glucose-Capillary: 96 mg/dL (ref 65–99)

## 2016-05-22 LAB — DIFFERENTIAL
BASOS PCT: 1 %
Basophils Absolute: 0.1 10*3/uL (ref 0.0–0.1)
EOS PCT: 6 %
Eosinophils Absolute: 0.6 10*3/uL (ref 0.0–0.7)
Lymphocytes Relative: 38 %
Lymphs Abs: 3.8 10*3/uL (ref 0.7–4.0)
Monocytes Absolute: 0.9 10*3/uL (ref 0.1–1.0)
Monocytes Relative: 9 %
NEUTROS ABS: 4.7 10*3/uL (ref 1.7–7.7)
Neutrophils Relative %: 46 %

## 2016-05-22 LAB — CBC
HCT: 43.7 % (ref 39.0–52.0)
Hemoglobin: 14.9 g/dL (ref 13.0–17.0)
MCH: 32.3 pg (ref 26.0–34.0)
MCHC: 34.1 g/dL (ref 30.0–36.0)
MCV: 94.8 fL (ref 78.0–100.0)
Platelets: 255 10*3/uL (ref 150–400)
RBC: 4.61 MIL/uL (ref 4.22–5.81)
RDW: 14.1 % (ref 11.5–15.5)
WBC: 10 10*3/uL (ref 4.0–10.5)

## 2016-05-22 LAB — ETHANOL

## 2016-05-22 LAB — PROTIME-INR
INR: 0.95
PROTHROMBIN TIME: 12.7 s (ref 11.4–15.2)

## 2016-05-22 LAB — RAPID URINE DRUG SCREEN, HOSP PERFORMED
AMPHETAMINES: NOT DETECTED
BENZODIAZEPINES: NOT DETECTED
Barbiturates: NOT DETECTED
Cocaine: NOT DETECTED
OPIATES: NOT DETECTED
TETRAHYDROCANNABINOL: NOT DETECTED

## 2016-05-22 LAB — APTT: aPTT: 27 seconds (ref 24–36)

## 2016-05-22 LAB — AMMONIA: Ammonia: 27 umol/L (ref 9–35)

## 2016-05-22 MED ORDER — ASPIRIN 325 MG PO TABS
325.0000 mg | ORAL_TABLET | Freq: Every day | ORAL | Status: DC
  Administered 2016-05-23: 325 mg via ORAL
  Filled 2016-05-22: qty 1

## 2016-05-22 MED ORDER — ALLOPURINOL 100 MG PO TABS
300.0000 mg | ORAL_TABLET | Freq: Every day | ORAL | Status: DC
  Administered 2016-05-23: 300 mg via ORAL
  Filled 2016-05-22: qty 3

## 2016-05-22 MED ORDER — LORATADINE 10 MG PO TABS
10.0000 mg | ORAL_TABLET | Freq: Every day | ORAL | Status: DC
  Administered 2016-05-23: 10 mg via ORAL
  Filled 2016-05-22: qty 1

## 2016-05-22 MED ORDER — HYPROMELLOSE (GONIOSCOPIC) 2.5 % OP SOLN: 1.0000 [drp] | Freq: Three times a day (TID) | OPHTHALMIC | Status: DC | PRN

## 2016-05-22 MED ORDER — SENNOSIDES-DOCUSATE SODIUM 8.6-50 MG PO TABS
1.0000 | ORAL_TABLET | Freq: Every evening | ORAL | Status: DC | PRN
  Filled 2016-05-22: qty 1

## 2016-05-22 MED ORDER — ENOXAPARIN SODIUM 40 MG/0.4ML ~~LOC~~ SOLN
40.0000 mg | Freq: Every day | SUBCUTANEOUS | Status: DC
Start: 2016-05-23 — End: 2016-05-23
  Administered 2016-05-23: 40 mg via SUBCUTANEOUS
  Filled 2016-05-22: qty 0.4

## 2016-05-22 MED ORDER — AMLODIPINE BESYLATE 5 MG PO TABS
5.0000 mg | ORAL_TABLET | Freq: Every morning | ORAL | Status: DC
  Administered 2016-05-23: 5 mg via ORAL
  Filled 2016-05-22: qty 1

## 2016-05-22 MED ORDER — ATENOLOL 25 MG PO TABS
50.0000 mg | ORAL_TABLET | Freq: Every morning | ORAL | Status: DC
Start: 2016-05-23 — End: 2016-05-23
  Administered 2016-05-23: 50 mg via ORAL
  Filled 2016-05-22: qty 2

## 2016-05-22 MED ORDER — ASPIRIN EC 81 MG PO TBEC: 81.0000 mg | DELAYED_RELEASE_TABLET | Freq: Every day | ORAL | Status: DC

## 2016-05-22 MED ORDER — STROKE: EARLY STAGES OF RECOVERY BOOK
Freq: Once | Status: DC
  Filled 2016-05-22 (×2): qty 1

## 2016-05-22 MED ORDER — PANTOPRAZOLE SODIUM 40 MG PO TBEC
40.0000 mg | DELAYED_RELEASE_TABLET | Freq: Every day | ORAL | Status: DC
Start: 2016-05-23 — End: 2016-05-23
  Administered 2016-05-23: 40 mg via ORAL
  Filled 2016-05-22: qty 1

## 2016-05-22 MED ORDER — HYDROCHLOROTHIAZIDE 12.5 MG PO CAPS
12.5000 mg | ORAL_CAPSULE | Freq: Every day | ORAL | Status: DC
  Administered 2016-05-23: 12.5 mg via ORAL
  Filled 2016-05-22: qty 1

## 2016-05-22 NOTE — ED Notes (Signed)
Pt wife states that pt told her he believes he had a stroke this weekend.  A friend that he spoke to on the phone today about 1530 told him he had some slurring of his speech, wife thought he was normal when she saw him this morning.

## 2016-05-22 NOTE — H&P (Signed)
History and Physical    GREGRY SAXON [id number] DOB: Aug 09, 1941 DOA: 05/22/2016   PCP: Thressa Sheller, MD Chief Complaint:  Chief Complaint  Patient presents with  . Weakness    HPI: Thomas Bond is a 75 y.o. male with medical history significant of HTN.  Patient presents to ED for evaluation of slurred speech.  He was somewhat aware of it earlier today.  Symptoms onset about 3 days ago he says.  Since then everyone he speaks with on phone says he sounds like his is drunk.  He also has had generalized weakness and fatigue.  Headache.  ED Course: MRI brain does demonstrate acute pontine CVA.  Review of Systems: As per HPI otherwise 10 point review of systems negative.    Past Medical History:  Diagnosis Date  . GERD (gastroesophageal reflux disease)   . Gout   . Hypertension   . Sleep apnea 2013   told has sleep apnea afer sleep study, no follow up study done    Past Surgical History:  Procedure Laterality Date  . COLONOSCOPY WITH PROPOFOL N/A 10/10/2014   Procedure: COLONOSCOPY WITH PROPOFOL;  Surgeon: Garlan Fair, MD;  Location: [facility];  Service: Endoscopy;  Laterality: N/A;  . HERNIA REPAIR Bilateral 1984 and 1998     reports that he has never smoked. He has never used smokeless tobacco. He reports that he drinks alcohol. He reports that he does not use drugs.  No Known Allergies  Family History  Problem Relation Age of Onset  . Heart attack Brother   . Stroke Neg Hx       Prior to Admission medications   Medication Sig Start Date End Date Taking? Authorizing Provider  allopurinol (ZYLOPRIM) 300 MG tablet Take 300 mg by mouth daily.   Yes Historical Provider, MD  amLODipine (NORVASC) 5 MG tablet Take 5 mg by mouth every morning.   Yes Historical Provider, MD  aspirin EC 81 MG tablet Take 81 mg by mouth daily.   Yes Historical Provider, MD  atenolol (TENORMIN) 50 MG tablet Take 50 mg by mouth every morning.   Yes Historical Provider, MD    cetirizine (ZYRTEC) 10 MG tablet Take 10 mg by mouth daily.   Yes Historical Provider, MD  hydrochlorothiazide (MICROZIDE) 12.5 MG capsule Take 12.5 mg by mouth daily.   Yes Historical Provider, MD  hydroxypropyl methylcellulose / hypromellose (ISOPTO TEARS / GONIOVISC) 2.5 % ophthalmic solution Place 1 drop into both eyes 3 (three) times daily as needed for dry eyes.   Yes Historical Provider, MD  omeprazole (PRILOSEC) 20 MG capsule Take 20 mg by mouth daily.   Yes Historical Provider, MD    Physical Exam: Vitals:   05/22/16 2058 05/22/16 2111 05/22/16 2212 05/22/16 2230  BP:  171/74 110/81 158/67  Pulse:  64 (!) 58 (!) 56  Resp:  17 16 17   Temp: 97.8 F (36.6 C)     TempSrc:      SpO2:  98% 97% 97%  Weight:      Height:          Constitutional: NAD, calm, comfortable Eyes: PERRL, lids and conjunctivae normal ENMT: Mucous membranes are moist. Posterior pharynx clear of any exudate or lesions.Normal dentition.  Neck: normal, supple, no masses, no thyromegaly Respiratory: clear to auscultation bilaterally, no wheezing, no crackles. Normal respiratory effort. No accessory muscle use.  Cardiovascular: Regular rate and rhythm, no murmurs / rubs / gallops. No extremity edema. 2+ pedal pulses. No carotid bruits.  Abdomen: no tenderness, no masses palpated. No hepatosplenomegaly. Bowel sounds positive.  Musculoskeletal: no clubbing / cyanosis. No joint deformity upper and lower extremities. Good ROM, no contractures. Normal muscle tone.  Skin: no rashes, lesions, ulcers. No induration Neurologic: Mild dysarthria, no aphasia.  Strength 5/5 throughout. Psychiatric: Normal judgment and insight. Alert and oriented x 3. Normal mood.    Labs on Admission: I have personally reviewed following labs and imaging studies  CBC:  Recent Labs Lab 05/22/16 1829 05/22/16 1838  WBC 10.0  --   NEUTROABS 4.7  --   HGB 14.9 16.3  HCT 43.7 48.0  MCV 94.8  --   PLT 255  --    Basic Metabolic  Panel:  Recent Labs Lab 05/22/16 1829 05/22/16 1838  NA 137 140  K 3.4* 3.5  CL 100* 100*  CO2 25  --   GLUCOSE 95 94  BUN 12 14  CREATININE 1.28* 1.20  CALCIUM 9.5  --    GFR: Estimated Creatinine Clearance: 52.8 mL/min (by C-G formula based on SCr of 1.2 mg/dL). Liver Function Tests:  Recent Labs Lab 05/22/16 1829  AST 45*  ALT 36  ALKPHOS 54  BILITOT 0.8  PROT 7.9  ALBUMIN 4.8   No results for input(s): LIPASE, AMYLASE in the last 168 hours.  Recent Labs Lab 05/22/16 2037  AMMONIA 27   Coagulation Profile:  Recent Labs Lab 05/22/16 1829  INR 0.95   Cardiac Enzymes: No results for input(s): CKTOTAL, CKMB, CKMBINDEX, TROPONINI in the last 168 hours. BNP (last 3 results) No results for input(s): PROBNP in the last 8760 hours. HbA1C: No results for input(s): HGBA1C in the last 72 hours. CBG:  Recent Labs Lab 05/22/16 1839 05/22/16 2056  GLUCAP 96 82   Lipid Profile: No results for input(s): CHOL, HDL, LDLCALC, TRIG, CHOLHDL, LDLDIRECT in the last 72 hours. Thyroid Function Tests: No results for input(s): TSH, T4TOTAL, FREET4, T3FREE, THYROIDAB in the last 72 hours. Anemia Panel: No results for input(s): VITAMINB12, FOLATE, FERRITIN, TIBC, IRON, RETICCTPCT in the last 72 hours. Urine analysis: No results found for: COLORURINE, APPEARANCEUR, LABSPEC, PHURINE, GLUCOSEU, HGBUR, BILIRUBINUR, KETONESUR, PROTEINUR, UROBILINOGEN, NITRITE, LEUKOCYTESUR Sepsis Labs: @LABRCNTIP (procalcitonin:4,lacticidven:4) )No results found for this or any previous visit (from the past 240 hour(s)).   Radiological Exams on Admission: Ct Head Wo Contrast  Result Date: 05/22/2016 CLINICAL DATA:  Slurred speech and generalized weakness for 2 days EXAM: CT HEAD WITHOUT CONTRAST TECHNIQUE: Contiguous axial images were obtained from the base of the skull through the vertex without intravenous contrast. COMPARISON:  None. FINDINGS: Brain: No evidence of acute infarction,  hemorrhage, hydrocephalus, extra-axial collection or mass lesion/mass effect. Changes of mild atrophy and chronic white matter ischemic change are noted. Vascular: No hyperdense vessel or unexpected calcification. Skull: Normal. Negative for fracture or focal lesion. Sinuses/Orbits: No acute finding. IMPRESSION: Chronic atrophic and ischemic changes without acute abnormality. Electronically Signed   By: Inez Catalina M.D.   On: 05/22/2016 19:14   Mr Brain Wo Contrast  Result Date: 05/22/2016 CLINICAL DATA:  Dizziness and fatigue. Headache and slurred speech. Difficulty walking. EXAM: MRI HEAD WITHOUT CONTRAST TECHNIQUE: Multiplanar, multiecho pulse sequences of the brain and surrounding structures were obtained without intravenous contrast. COMPARISON:  Head CT 05/22/2016, brain MRI 12/09/2003 FINDINGS: Brain: There is focal diffusion restriction along the left central aspect of the pons. The midline structures are normal. There is multifocal hyperintense T2 weighted signal within the periventricular white matter compatible with chronic microvascular disease. No evidence  of acute hemorrhage. No mass lesion or midline shift. No hydrocephalus or extra-axial fluid collection. Vascular: Major intracranial flow voids are preserved. No evidence of chronic microhemorrhage or amyloid angiopathy. Skull and upper cervical spine: The visualized skull base, calvarium, upper cervical spine and extracranial soft tissues are normal. Sinuses/Orbits: No fluid levels or advanced mucosal thickening. No mastoid effusion. Normal orbits. Other: None. IMPRESSION: 1. 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 2. No mass effect or acute hemorrhage. 3. Findings of chronic microvascular disease. These results were called by telephone at the time of interpretation on 05/22/2016 at 10:08 pm to Dr. Daleen Bo , who verbally acknowledged these results. Electronically  Signed   By: Ulyses Jarred M.D.   On: 05/22/2016 22:08    EKG: Independently reviewed.  Assessment/Plan Active Problems:   Acute ischemic stroke ([place])    1. Acute ischemic stroke - 1. Stroke pathway 2. Tele monitor 3. ASA 325 4. 2d echo and carotid dopplers 5. Will defer decision on what posterior circulation studies to get to neurology (CTA vs MRA) 2. HTN - continue home meds   DVT prophylaxis: Lovenox Code Status: Full Family Communication: Wife at bedside Consults called: Paged Dr. Nicole Kindred Admission status: Admit to inpatient   Etta Quill DO [facility] Pager [phone] from 7PM-7AM  If 7AM-7PM, please contact the day physician for the patient [email or web address] Password [email or web address]  05/22/2016, 10:46 PM

## 2016-05-22 NOTE — ED Notes (Signed)
Stroke Swallow screen has been completed and passed. Only neuro deficit is slurred speech, no other changes since arrival to the ED, alert/oriented. Next 2 hour neuro check 2300.

## 2016-05-22 NOTE — ED Notes (Signed)
Pt was given urinal, unable to urinate at this time

## 2016-05-22 NOTE — ED Provider Notes (Signed)
[facility] DEPT Provider Note   CSN: [id number] Arrival date & time: 05/22/16  1805     History   Chief Complaint Chief Complaint  Patient presents with  . Weakness    HPI Thomas Bond is a 75 y.o. male.  He presents for evaluation of slurred speech, at the recommendation of some friends. He states that he was somewhat aware of it earlier today, and over the weekend, but after talking to a friend on the phone at 3:30, who told him he sounded like he was drunk, the patient decided to come here. He feels like the symptoms started 5 days ago, were present for a couple of days then resolved. Today he feels fatigued, dizzy, and has noted some slurring speech. He is unaware of exact time of onset. His wife, was away all day, but felt like he was pretty normal, earlier before she left. He denies drinking alcohol today. He denies recent illness including fever, chills, nausea, vomiting, cough, shortness of breath, chest pain or abdominal pain. There are no other known modifying factors.    HPI  Past Medical History:  Diagnosis Date  . GERD (gastroesophageal reflux disease)   . Gout   . Hypertension   . Sleep apnea 2013   told has sleep apnea afer sleep study, no follow up study done    Patient Active Problem List   Diagnosis Date Noted  . Acute ischemic stroke ([place]) 05/22/2016    Past Surgical History:  Procedure Laterality Date  . COLONOSCOPY WITH PROPOFOL N/A 10/10/2014   Procedure: COLONOSCOPY WITH PROPOFOL;  Surgeon: Garlan Fair, MD;  Location: [facility];  Service: Endoscopy;  Laterality: N/A;  . HERNIA REPAIR Bilateral 1984 and 1998       Home Medications    Prior to Admission medications   Medication Sig Start Date End Date Taking? Authorizing Provider  allopurinol (ZYLOPRIM) 300 MG tablet Take 300 mg by mouth daily.   Yes Historical Provider, MD  amLODipine (NORVASC) 5 MG tablet Take 5 mg by mouth every morning.   Yes Historical Provider, MD    aspirin EC 81 MG tablet Take 81 mg by mouth daily.   Yes Historical Provider, MD  atenolol (TENORMIN) 50 MG tablet Take 50 mg by mouth every morning.   Yes Historical Provider, MD  cetirizine (ZYRTEC) 10 MG tablet Take 10 mg by mouth daily.   Yes Historical Provider, MD  hydrochlorothiazide (MICROZIDE) 12.5 MG capsule Take 12.5 mg by mouth daily.   Yes Historical Provider, MD  hydroxypropyl methylcellulose / hypromellose (ISOPTO TEARS / GONIOVISC) 2.5 % ophthalmic solution Place 1 drop into both eyes 3 (three) times daily as needed for dry eyes.   Yes Historical Provider, MD  omeprazole (PRILOSEC) 20 MG capsule Take 20 mg by mouth daily.   Yes Historical Provider, MD    Family History No family history on file.  Social History Social History  Substance Use Topics  . Smoking status: Never Smoker  . Smokeless tobacco: Never Used  . Alcohol use Yes     Comment: occasionally beer on weekends     Allergies   Review of patient's allergies indicates no known allergies.   Review of Systems Review of Systems  All other systems reviewed and are negative.    Physical Exam Updated Vital Signs BP 110/81 (BP Location: Right Arm)   Pulse (!) 58   Temp 97.8 F (36.6 C)   Resp 16   Ht 5\' 6"  (1.676 m)  Wt 188 lb 4 oz (85.4 kg)   SpO2 97%   BMI 30.38 kg/m   Physical Exam  Constitutional: He is oriented to person, place, and time. He appears well-developed. No distress.  Elderly, frail  HENT:  Head: Normocephalic and atraumatic.  Right Ear: External ear normal.  Left Ear: External ear normal.  Eyes: Conjunctivae and EOM are normal. Pupils are equal, round, and reactive to light.  Neck: Normal range of motion and phonation normal. Neck supple.  Cardiovascular: Normal rate, regular rhythm and normal heart sounds.   Pulmonary/Chest: Effort normal and breath sounds normal. He exhibits no bony tenderness.  Abdominal: Soft. There is no tenderness.  Musculoskeletal: Normal range of  motion.  Neurological: He is alert and oriented to person, place, and time. No cranial nerve deficit or sensory deficit. He exhibits normal muscle tone. Coordination normal.  Mild dysarthria. No aphasia or nystagmus. No pronator drift. Normal range of motion and strength, arms and legs bilaterally.  Skin: Skin is warm, dry and intact.  Psychiatric: He has a normal mood and affect. His behavior is normal. Judgment and thought content normal.  Nursing note and vitals reviewed.    ED Treatments / Results  Labs (all labs ordered are listed, but only abnormal results are displayed) Labs Reviewed  COMPREHENSIVE METABOLIC PANEL - Abnormal; Notable for the following:       Result Value   Potassium 3.4 (*)    Chloride 100 (*)    Creatinine, Ser 1.28 (*)    AST 45 (*)    GFR calc non Af Amer 52 (*)    All other components within normal limits  I-STAT CHEM 8, ED - Abnormal; Notable for the following:    Chloride 100 (*)    Calcium, Ion 1.12 (*)    All other components within normal limits  PROTIME-INR  APTT  CBC  DIFFERENTIAL  AMMONIA  ETHANOL  URINE RAPID DRUG SCREEN, HOSP PERFORMED  HEMOGLOBIN A1C  LIPID PANEL  I-STAT TROPOININ, ED  CBG MONITORING, ED  CBG MONITORING, ED    EKG  EKG Interpretation  Date/Time:  Wednesday May 22 2016 18:09:46 EDT Ventricular Rate:  69 PR Interval:  174 QRS Duration: 102 QT Interval:  440 QTC Calculation: 471 R Axis:   -57 Text Interpretation:  Sinus rhythm with Premature supraventricular complexes Left anterior fascicular block Moderate voltage criteria for LVH, may be normal variant Abnormal ECG No old tracing to compare Confirmed by Saron Vanorman  MD, Carlyne Keehan [phone]) on 05/22/2016 8:33:44 PM       Radiology Ct Head Wo Contrast  Result Date: 05/22/2016 CLINICAL DATA:  Slurred speech and generalized weakness for 2 days EXAM: CT HEAD WITHOUT CONTRAST TECHNIQUE: Contiguous axial images were obtained from the base of the skull through the vertex  without intravenous contrast. COMPARISON:  None. FINDINGS: Brain: No evidence of acute infarction, hemorrhage, hydrocephalus, extra-axial collection or mass lesion/mass effect. Changes of mild atrophy and chronic white matter ischemic change are noted. Vascular: No hyperdense vessel or unexpected calcification. Skull: Normal. Negative for fracture or focal lesion. Sinuses/Orbits: No acute finding. IMPRESSION: Chronic atrophic and ischemic changes without acute abnormality. Electronically Signed   By: Inez Catalina M.D.   On: 05/22/2016 19:14   Mr Brain Wo Contrast  Result Date: 05/22/2016 CLINICAL DATA:  Dizziness and fatigue. Headache and slurred speech. Difficulty walking. EXAM: MRI HEAD WITHOUT CONTRAST TECHNIQUE: Multiplanar, multiecho pulse sequences of the brain and surrounding structures were obtained without intravenous contrast. COMPARISON:  Head CT  05/22/2016, brain MRI 12/09/2003 FINDINGS: Brain: There is focal diffusion restriction along the left central aspect of the pons. The midline structures are normal. There is multifocal hyperintense T2 weighted signal within the periventricular white matter compatible with chronic microvascular disease. No evidence of acute hemorrhage. No mass lesion or midline shift. No hydrocephalus or extra-axial fluid collection. Vascular: Major intracranial flow voids are preserved. No evidence of chronic microhemorrhage or amyloid angiopathy. Skull and upper cervical spine: The visualized skull base, calvarium, upper cervical spine and extracranial soft tissues are normal. Sinuses/Orbits: No fluid levels or advanced mucosal thickening. No mastoid effusion. Normal orbits. Other: None. IMPRESSION: 1. 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 2. No mass effect or acute hemorrhage. 3. Findings of chronic microvascular disease. These results were called by telephone at the time of interpretation on  05/22/2016 at 10:08 pm to Dr. Daleen Bo , who verbally acknowledged these results. Electronically Signed   By: Ulyses Jarred M.D.   On: 05/22/2016 22:08    Procedures Procedures (including critical care time)  Medications Ordered in ED Medications  pantoprazole (PROTONIX) EC tablet 40 mg (not administered)  allopurinol (ZYLOPRIM) tablet 300 mg (not administered)  atenolol (TENORMIN) tablet 50 mg (not administered)  amLODipine (NORVASC) tablet 5 mg (not administered)  loratadine (CLARITIN) tablet 10 mg (not administered)  hydroxypropyl methylcellulose / hypromellose (ISOPTO TEARS / GONIOVISC) 2.5 % ophthalmic solution 1 drop (not administered)  hydrochlorothiazide (MICROZIDE) capsule 12.5 mg (not administered)  aspirin tablet 325 mg (not administered)   stroke: mapping our early stages of recovery book (not administered)  senna-docusate (Senokot-S) tablet 1 tablet (not administered)  enoxaparin (LOVENOX) injection 40 mg (not administered)     Initial Impression / Assessment and Plan / ED Course  I have reviewed the triage vital signs and the nursing notes.  Pertinent labs & imaging results that were available during my care of the patient were reviewed by me and considered in my medical decision making (see chart for details).  Clinical Course    Medications  pantoprazole (PROTONIX) EC tablet 40 mg (not administered)  allopurinol (ZYLOPRIM) tablet 300 mg (not administered)  atenolol (TENORMIN) tablet 50 mg (not administered)  amLODipine (NORVASC) tablet 5 mg (not administered)  loratadine (CLARITIN) tablet 10 mg (not administered)  hydroxypropyl methylcellulose / hypromellose (ISOPTO TEARS / GONIOVISC) 2.5 % ophthalmic solution 1 drop (not administered)  hydrochlorothiazide (MICROZIDE) capsule 12.5 mg (not administered)  aspirin tablet 325 mg (not administered)   stroke: mapping our early stages of recovery book (not administered)  senna-docusate (Senokot-S) tablet 1 tablet  (not administered)  enoxaparin (LOVENOX) injection 40 mg (not administered)    Patient Vitals for the past 24 hrs:  BP Temp Temp src Pulse Resp SpO2 Height Weight  05/22/16 2212 110/81 - - (!) 58 16 97 % - -  05/22/16 2111 171/74 - - 64 17 98 % - -  05/22/16 2058 - 97.8 F (36.6 C) - - - - - -  05/22/16 2050 (!) 193/114 - - 96 23 98 % - -  05/22/16 1808 199/85 97.9 F (36.6 C) Oral 72 18 99 % 5\' 6"  (1.676 m) 188 lb 4 oz (85.4 kg)    10:31 PM Reevaluation with update and discussion. After initial assessment and treatment, an updated evaluation reveals No change in clinical status. Findings discussed with patient and wife, all questions answered. Militza Devery L    Final Clinical Impressions(s) / ED  Diagnoses   Final diagnoses:  Cerebral infarction due to unspecified mechanism    Acute CVA, time undetermined. He did not meet criteria for thrombolysis in the emergency department. Time of presentation after onset of symptoms, is not clear. Will require hospitalization for further evaluation and treatment.   Nursing Notes Reviewed/ Care Coordinated Applicable Imaging Reviewed Interpretation of Laboratory Data incorporated into ED treatment  Plan: Admit  New Prescriptions New Prescriptions   No medications on file     Daleen Bo, MD 05/22/16 2232

## 2016-05-22 NOTE — ED Notes (Signed)
Admitting physician with pt for admission assessment

## 2016-05-22 NOTE — ED Triage Notes (Signed)
The pt is c/o dizziness fatigue headache  slutted speech walking difficulty  For the past 1-2 weeks.  Very hard of hearing  Alert oriented skin warm and dry

## 2016-05-22 NOTE — ED Notes (Signed)
Patient transported to MRI 

## 2016-05-22 NOTE — ED Triage Notes (Signed)
The pt has no facial droop equal grips no arm or leg drift

## 2016-05-23 ENCOUNTER — Inpatient Hospital Stay (HOSPITAL_COMMUNITY): Payer: Commercial Managed Care - HMO

## 2016-05-23 DIAGNOSIS — I639 Cerebral infarction, unspecified: Secondary | ICD-10-CM

## 2016-05-23 LAB — ECHOCARDIOGRAM COMPLETE
Height: 66 in
Weight: 2973.56 oz

## 2016-05-23 LAB — LIPID PANEL
Cholesterol: 197 mg/dL (ref 0–200)
HDL: 30 mg/dL — ABNORMAL LOW (ref >40)
LDL CALC: 120 mg/dL — AB (ref 0–99)
TRIGLYCERIDES: 236 mg/dL — AB (ref <150)
Total CHOL/HDL Ratio: 6.6 RATIO
VLDL: 47 mg/dL — ABNORMAL HIGH (ref 0–40)

## 2016-05-23 MED ORDER — CLOPIDOGREL BISULFATE 75 MG PO TABS
75.0000 mg | ORAL_TABLET | Freq: Every day | ORAL | 0 refills | Status: DC
Start: 2016-05-24 — End: 2021-08-17

## 2016-05-23 MED ORDER — ATORVASTATIN CALCIUM 20 MG PO TABS: 20.0000 mg | ORAL_TABLET | Freq: Every day | ORAL | 0 refills | Status: DC

## 2016-05-23 MED ORDER — CLOPIDOGREL BISULFATE 75 MG PO TABS: 75.0000 mg | ORAL_TABLET | Freq: Every day | ORAL | Status: DC

## 2016-05-23 MED ORDER — POTASSIUM CHLORIDE CRYS ER 20 MEQ PO TBCR
40.0000 meq | EXTENDED_RELEASE_TABLET | Freq: Once | ORAL | Status: DC
  Filled 2016-05-23: qty 2

## 2016-05-23 MED ORDER — ATORVASTATIN CALCIUM 10 MG PO TABS: 20.0000 mg | ORAL_TABLET | Freq: Every day | ORAL | Status: DC

## 2016-05-23 NOTE — Evaluation (Signed)
Physical Therapy Evaluation Patient Details Name: Thomas Bond MRN: [id number] DOB: 1941/04/21 Today's Date: 05/23/2016   History of Present Illness  75 y.o.malewith medical history significant of HTN. Patient presents to ED for evaluation of slurred speech. He also has had generalized weakness and fatigue. MRI on 9/6 + for acute infarct within the left central aspect of the pons.  Clinical Impression  Patient presents with word finding difficulties, memory issues, slurred speech and mild balance deficits (which pt reports as premorbid) s/p CVA. Tolerated higher level balance challenges with only minor deviations in gait but no overt LOB. Pt independent PTA, likes to garden. Reports hx of memory issues for the past several months; some difficulty noted with multi step commands/tasks during session today. Scored 21/24 on DGI so not a fall risk. Pt functioning close to baseline with regards to mobility and reports no falls. Recommend further cognitive assessment. Discussed signs/symptoms of CVA. Pt does not require skilled therapy services. Discharge from therapy.    Follow Up Recommendations No PT follow up;Supervision - Intermittent    Equipment Recommendations  None recommended by PT    Recommendations for Other Services       Precautions / Restrictions Precautions Precautions: None Restrictions Weight Bearing Restrictions: No      Mobility  Bed Mobility Overal bed mobility: Modified Independent             General bed mobility comments: Standing in room with OT upon PT arrival.   Transfers Overall transfer level: Needs assistance Equipment used: None Transfers: Sit to/from Stand Sit to Stand: Independent         General transfer comment: Stood without assist or difficulty. No LOB or dizziness.  Ambulation/Gait Ambulation/Gait assistance: Modified independent (Device/Increase time) Ambulation Distance (Feet): 400 Feet Assistive device: None Gait  Pattern/deviations: Step-through pattern;Decreased stride length   Gait velocity interpretation: at or above normal speed for age/gender General Gait Details: Steady gait. Tolerated higher level balance challenges with only mild deviations in gait no LOB.  Stairs Stairs: Yes Stairs assistance: Modified independent (Device/Increase time) Stair Management: One rail Right;Alternating pattern Number of Stairs: 3 (+ 2 steps x2 bouts) General stair comments: Cues for safety.   Wheelchair Mobility    Modified Rankin (Stroke Patients Only) Modified Rankin (Stroke Patients Only) Pre-Morbid Rankin Score: Slight disability Modified Rankin: Slight disability     Balance Overall balance assessment: Needs assistance Sitting-balance support: Feet supported;No upper extremity supported Sitting balance-Leahy Scale: Normal     Standing balance support: During functional activity Standing balance-Leahy Scale: Good Standing balance comment: LOB x1 with higher level balance activity             High level balance activites: Head turns;Sudden stops;Turns;Direction changes High Level Balance Comments: Tolerated above with only mild deviations in gait but no overt LOB. Able to perform 360 degree turn. Reports baseline balance impairments but no falls. Standardized Balance Assessment Standardized Balance Assessment : Dynamic Gait Index   Dynamic Gait Index Level Surface: Normal Change in Gait Speed: Normal Gait with Horizontal Head Turns: Normal Gait with Vertical Head Turns: Mild Impairment Gait and Pivot Turn: Normal Step Over Obstacle: Mild Impairment Step Around Obstacles: Normal Steps: Mild Impairment Total Score: 21       Pertinent Vitals/Pain Pain Assessment: No/denies pain    Home Living Family/patient expects to be discharged to:: Private residence Living Arrangements: Spouse/significant other Available Help at Discharge: Family;Available PRN/intermittently Type of Home:  House Home Access: Stairs to enter Entrance Stairs-Rails: Right Entrance  Stairs-Number of Steps: 3-4 Home Layout: One level Home Equipment: [place] - 2 wheels;Walker - 4 wheels;Cane - single point;Bedside commode;Shower seat Additional Comments: All equipment is wifes    Prior Function Level of Independence: Independent         Comments: drives, gardens, occasionally plays golf.     Hand Dominance   Dominant Hand: Right    Extremity/Trunk Assessment   Upper Extremity Assessment: Defer to OT evaluation           Lower Extremity Assessment: Overall WFL for tasks assessed      Cervical / Trunk Assessment: Kyphotic  Communication   Communication: Expressive difficulties (mild slurred speech; reports some word finding difficulties.)  Cognition Arousal/Alertness: Awake/alert Behavior During Therapy: WFL for tasks assessed/performed Overall Cognitive Status: Impaired/Different from baseline Area of Impairment: Safety/judgement     Memory: Decreased short-term memory (Self reports some memory issues - "for the past few months, my wife and I are in the same boat.") Following Commands: Follows multi-step commands inconsistently Safety/Judgement: Decreased awareness of deficits;Decreased awareness of safety   Problem Solving: Requires verbal cues General Comments: Pt with difficulty following multi step tasks during session. Reports "the only thing wrong with me is my head; I dont need PT".     General Comments      Exercises        Assessment/Plan    PT Assessment Patent does not need any further PT services  PT Diagnosis Difficulty walking   PT Problem List    PT Treatment Interventions     PT Goals (Current goals can be found in the Care Plan section) Acute Rehab PT Goals Patient Stated Goal: go home PT Goal Formulation: All assessment and education complete, DC therapy    Frequency     Barriers to discharge        Co-evaluation                End of Session Equipment Utilized During Treatment: Gait belt Activity Tolerance: Patient tolerated treatment well Patient left: in chair;with call bell/phone within reach Nurse Communication: Mobility status         Time: 0950-1010 PT Time Calculation (min) (ACUTE ONLY): 20 min   Charges:   PT Evaluation $PT Eval Moderate Complexity: 1 Procedure     PT G Codes:        Nusaiba Guallpa A Ilea Hilton 05/23/2016, 11:12 AM Wray Kearns, PT, DPT [phone]

## 2016-05-23 NOTE — Evaluation (Signed)
Speech Language Pathology Evaluation Patient Details Name: Thomas Bond MRN: [id number] DOB: Jul 22, 1941 Today's Date: 05/23/2016 Time: [id number] SLP Time Calculation (min) (ACUTE ONLY): 33 min  Problem List:  Patient Active Problem List   Diagnosis Date Noted  . Acute ischemic stroke ([place]) 05/22/2016   Past Medical History:  Past Medical History:  Diagnosis Date  . GERD (gastroesophageal reflux disease)   . Gout   . Hypertension   . Sleep apnea 2013   told has sleep apnea afer sleep study, no follow up study done   Past Surgical History:  Past Surgical History:  Procedure Laterality Date  . COLONOSCOPY WITH PROPOFOL N/A 10/10/2014   Procedure: COLONOSCOPY WITH PROPOFOL;  Surgeon: Garlan Fair, MD;  Location: [facility];  Service: Endoscopy;  Laterality: N/A;  . HERNIA REPAIR Bilateral 1984 and 1998   HPI:  Thomas Bond is a 75 y.o. male with medical history significant of HTN.  Patient presents to ED for evaluation of slurred speech.  He was somewhat aware of it earlier today.  Symptoms onset about 3 days ago he says.  Since then everyone he speaks with on phone says he sounds like his is drunk.  He also has had generalized weakness and fatigue.  Headache. MRI positive for pontine CVA   Assessment / Plan / Recommendation Clinical Impression  Pt referred for speech/language/cognitive assessment following acute pontine stroke with slurred speech. Pt and spouse both report that he has returned to baseline at this point, but does have some mild cognitive impairments at baseline. Pt scored 25/30 possible points on the [facility] Cognitive Assessment which is consistent with very mild impairment. Encouraged caution upon return home with pt and his spouse completing high risk cognitive-linguistic activities (such as medication and financial management) together to ensure accuracy. Educated re: mental fatigue. Pt and spouse both verbalized understanding of all information  discussed during the assessment period. No further SLP services indicated at this time.     SLP Assessment  Patient does not need any further Speech Lanaguage Pathology Services    Follow Up Recommendations  24 hour supervision/assistance    Frequency and Duration           SLP Evaluation Prior Functioning  Cognitive/Linguistic Baseline: Baseline deficits Baseline deficit details: memory impairment Type of Home: House  Lives With: Spouse Vocation:  (retired Optometrist)   Cognition  Overall Cognitive Status: History of cognitive impairments - at baseline Arousal/Alertness: Awake/alert Orientation Level: Oriented X4 Attention: Sustained Sustained Attention: Impaired Sustained Attention Impairment: Verbal complex Memory: Impaired Memory Impairment: Decreased recall of new information Awareness: Appears intact Problem Solving: Appears intact Executive Function:  (high level limitations) Safety/Judgment: Appears intact    Comprehension  Auditory Comprehension Overall Auditory Comprehension: Appears within functional limits for tasks assessed Reading Comprehension Reading Status: Within funtional limits    Expression Expression Primary Mode of Expression: Verbal Verbal Expression Overall Verbal Expression: Appears within functional limits for tasks assessed Written Expression Dominant Hand: Right Written Expression: Within Functional Limits   Oral / Motor  Oral Motor/Sensory Function Overall Oral Motor/Sensory Function: Within functional limits Motor Speech Overall Motor Speech: Impaired Respiration: Within functional limits Phonation: Normal Resonance: Within functional limits Articulation: Impaired Level of Impairment: Conversation Intelligibility: Intelligible Motor Planning: Witnin functional limits Effective Techniques: Slow rate;Over-articulate   GO          Functional Assessment Tool Used: clinical judgement Functional Limitations: Spoken language  expressive Spoken Language Expression Current Status [id number]): 0 percent impaired, limited or restricted  Spoken Language Expression Goal Status [phone]): 0 percent impaired, limited or restricted Spoken Language Expression Discharge Status [id number]): 0 percent impaired, limited or restricted         Vinetta Bergamo MA, [place] Pager [phone] 05/23/2016, 4:39 PM

## 2016-05-23 NOTE — Consult Note (Signed)
Referring Physician: Dr Thomas Bond    Chief Complaint: Acute infarct within the left central aspect of the pons by MRI  HPI: Thomas Bond is an 75 y.o. male admitted yesterday through the emergency department for evaluation of slurred speech. Symptoms began 3 days prior to admission. 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 He has had fluctuating symptoms. He had some slurred speech but was able to speak clearly. He had no trouble understanding what other people for saying. His symptoms seemed to have improved since admission. On inquiry she admits to being slightly dizzy and off balance but he was able to walk without any assistance and did not have any falls. He denied any accompanying headache, double vision, blurred vision, focal extremity weakness or numbness. He denies any prior history of strokes or TIAs. He does have hypertension and sleep apnea but states he is compliant with his treatment. He does take aspirin every day.  Date last known well: 05/21/16 Time last known well: 05/21/16 tPA Given: Out of treatment window at time of presentation.  Past Medical History:  Diagnosis Date  . GERD (gastroesophageal reflux disease)   . Gout   . Hypertension   . Sleep apnea 2013   told has sleep apnea afer sleep study, no follow up study done    Past Surgical History:  Procedure Laterality Date  . COLONOSCOPY WITH PROPOFOL N/A 10/10/2014   Procedure: COLONOSCOPY WITH PROPOFOL;  Surgeon: Garlan Fair, MD;  Location: [facility];  Service: Endoscopy;  Laterality: N/A;  . HERNIA REPAIR Bilateral 1984 and 1998    Family History  Problem Relation Age of Onset  . Heart attack Brother   . Stroke Neg Hx    Social History:  reports that he has never smoked. He has never used smokeless tobacco. He reports that he drinks alcohol. He reports that he does not use drugs.  Allergies: No  Known Allergies  Medications:  Scheduled: .  stroke: mapping our early stages of recovery book   Does not apply Once  . allopurinol  300 mg Oral Daily  . amLODipine  5 mg Oral q morning - 10a  . aspirin  325 mg Oral Daily  . atenolol  50 mg Oral q morning - 10a  . enoxaparin (LOVENOX) injection  40 mg Subcutaneous Daily  . hydrochlorothiazide  12.5 mg Oral Daily  . loratadine  10 mg Oral Daily  . pantoprazole  40 mg Oral Daily    ROS: Positive for slurred speech, dizziness, gait imbalance and all other systems negative Physical Examination: Blood pressure (!) 170/73, pulse (!) 58, temperature 98.3 F (36.8 C), temperature source Oral, resp. rate 18, height 5\' 6"  (1.676 m), weight 84.3 kg (185 lb 13.6 oz), SpO2 100 %.  pleasant elderly Caucasian gentleman currently not in distress. . Afebrile. Head is nontraumatic. Neck is supple without bruit.    Cardiac exam no murmur or gallop. Lungs are clear to auscultation. Distal pulses are well felt. Neurological Exam ;  Awake  Alert oriented x 3. Normal speech and language.eye movements full without nystagmus.fundi were not visualized. Vision acuity and fields appear normal. Hearing is normal. Palatal movements are normal. Face symmetric. Tongue midline. Normal strength, tone, reflexes and coordination. Normal sensation. Gait deferred.  Laboratory Studies:  Basic Metabolic Panel:  Recent Labs Lab 05/22/16 1829 05/22/16 1838  NA 137 140  K 3.4* 3.5  CL 100* 100*  CO2 25  --   GLUCOSE 95 94  BUN 12 14  CREATININE 1.28* 1.20  CALCIUM 9.5  --     Liver Function Tests:  Recent Labs Lab 05/22/16 1829  AST 45*  ALT 36  ALKPHOS 54  BILITOT 0.8  PROT 7.9  ALBUMIN 4.8   No results for input(s): LIPASE, AMYLASE in the last 168 hours.  Recent Labs Lab 05/22/16 2037  AMMONIA 27    CBC:  Recent Labs Lab 05/22/16 1829 05/22/16 1838  WBC 10.0  --   NEUTROABS 4.7  --   HGB 14.9 16.3  HCT 43.7 48.0  MCV 94.8  --   PLT  255  --     Cardiac Enzymes: No results for input(s): CKTOTAL, CKMB, CKMBINDEX, TROPONINI in the last 168 hours.  BNP: Invalid input(s): POCBNP  CBG:  Recent Labs Lab 05/22/16 1839 05/22/16 2056  GLUCAP 96 82    Microbiology: No results found for this or any previous visit.  Coagulation Studies:  Recent Labs  05/22/16 1829  LABPROT 12.7  INR 0.95    Urinalysis: No results for input(s): COLORURINE, LABSPEC, PHURINE, GLUCOSEU, HGBUR, BILIRUBINUR, KETONESUR, PROTEINUR, UROBILINOGEN, NITRITE, LEUKOCYTESUR in the last 168 hours.  Invalid input(s): APPERANCEUR  Lipid Panel:    Component Value Date/Time   CHOL 197 05/23/2016 0300   TRIG 236 (H) 05/23/2016 0300   HDL 30 (L) 05/23/2016 0300   CHOLHDL 6.6 05/23/2016 0300   VLDL 47 (H) 05/23/2016 0300   LDLCALC 120 (H) 05/23/2016 0300    HgbA1C: No results found for: HGBA1C  Urine Drug Screen:     Component Value Date/Time   LABOPIA NONE DETECTED 05/22/2016 2037   COCAINSCRNUR NONE DETECTED 05/22/2016 2037   LABBENZ NONE DETECTED 05/22/2016 2037   AMPHETMU NONE DETECTED 05/22/2016 2037   THCU NONE DETECTED 05/22/2016 2037   LABBARB NONE DETECTED 05/22/2016 2037    Alcohol Level:  Recent Labs Lab 05/22/16 2037  ETH <5    Other results: EKG: Sinus rhythm with PVCs. Rate 69 bpm. Left anterior fascicular block. Please see formal cardiology reading for complete details.  Imaging:   Ct Head Wo Contrast 05/22/2016 Chronic atrophic and ischemic changes without acute abnormality.    Mr Thomas Bond Head Wo Contrast 05/23/2016 1. No large vessel occlusion.  2. High-grade stenoses of the P2 segments of both posterior cerebral arteries, likely due to atherosclerotic disease.  3. Otherwise normal MRA of the circle of Willis.    Mr Brain Wo Contrast 05/22/2016 1. 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  2. No mass effect or acute hemorrhage.   3. Findings of chronic microvascular disease.      Assessment: 75 y.o. male with history of obstructive sleep apnea, hypertension, gout, and gastroesophageal reflux disease presented with slurred speech and generalized weakness found to have an acute infarct within the left central aspect of the pons by MRI. Etiology likely small vessel disease.  Stroke Risk Factors - hypertension and obstructive sleep apnea  Plan: 1. HgbA1c, fasting lipid panel 2. MRI, MRA  of the brain without contrast 3. PT consult, OT consult, Speech consult 4. Echocardiogram 5. Carotid dopplers 6. Prophylactic therapy-Antiplatelet med: Plavix - dose 75 mg daily. 7. NPO until RN stroke swallow screen 8. Telemetry monitoring 9. Frequent neuro checks I have personally examined this patient, reviewed notes, independently viewed imaging studies, participated in medical decision making and plan  of care.ROS completed by me personally and pertinent positives fully documented  I have made any additions or clarifications directly to the above note.   Discussed with Dr. Algis Bond.  Greater than 50% of time during this 45 minute consultation visit was spent on counseling and coordination of care about stroke, explanation of diagnosis, evaluation, prevention, treatment and answered questions  Antony Contras, MD Medical Director [place] Pager: [phone] 05/23/2016 4:58 PM  05/23/2016, 1:51 PM

## 2016-05-23 NOTE — Discharge Summary (Signed)
Physician Discharge Summary  SAVIOUR MATHIAS [id number] DOB: 27-Nov-1940  PCP: Thressa Sheller, MD  Admit date: 05/22/2016 Discharge date: 05/23/2016  Admitted From: Home Disposition:  Home  Recommendations for Outpatient Follow-up:  1. Dr. Thressa Sheller, PCP in 1 week. Please follow hemoglobin A1c that was sent from the hospital. 2. Dr. Antony Contras, Neurology in 2 months. Request for appointment sent via Epic.  Home Health: Outpatient OT Equipment/Devices: None    Discharge Condition: Improved and stable.  CODE STATUS: Full  Diet recommendation: Heart Healthy diet.  Discharge Diagnoses:  Active Problems:   Acute ischemic stroke [facility])   Brief/Interim Summary: 75 year old male with PMH of HTN, GERD, gout, sleep apnea, presented to [facility] ED on 05/22/16 with complaints of slurred speech which began 3 days prior to admission. He noticed it but his wife did not. When he spoke to his 2 friends over the phone, they mentioned it to him. He then went on the Internet and felt that his symptoms of slurred speech and generalized weakness matched some of the symptoms that he read related to stroke and decided to come to the ED. He denied facial droop or asymmetrical limb weakness. He took aspirin 81 MG daily prior to admission. In the ED, MRI brain showed acute pontine CVA. Neurology was consulted.   Assessment and plan  Acute left brain stroke: Acute infarct within the left central pons - Resulting in dysarthria. Likely secondary to small vessel disease. - CT head is 05/22/16: Chronic atrophic and ischemic changes without acute abnormality - MRI brain 05/22/16: Acute infarct within the left central aspect of the pons, likely secondary to occlusion of a pontine perforating artery. Chronic microvascular disease. No mass effect or acute hemorrhage. - MRA brain 05/23/16: No large vessel occlusion. High-grade stenosis of the P2 segments of both posterior cerebral arteries, likely due to atherosclerotic  disease. Normal MRA of the circle of Willis. - 2-D echo: LVEF 55-60%. No cardiac source of emboli. - Carotid Dopplers: No significant (1-39 percent) stenosis. Antegrade vertebral flow. - LDL: 120 - Hemoglobin A1c: Pending. Can be followed as outpatient. - Patient was on aspirin 81 MG daily prior to admission. As per neurology recommendations, now on Plavix 75 MG daily for secondary stroke prophylaxis.  - Therapeutic recommendations: Outpatient OT - Discussed with Dr. Leonie Man: management as above and cleared for DC with OP follow up with him in 2 months.  Essential hypertension -Mildly uncontrolled. Okay given her recent stroke. Outpatient follow-up to consider adjustment of medications. Continue atenolol, amlodipine and HCTZ.  Hyperlipidemia LDL 120. Started atorvastatin 20 MG daily.  Gout  No acute flare at this time.     Possible stage II chronic kidney disease Admitted with creatinine of 1.28. Baseline creatinine not known. Follow-up with PCP with repeat BMP in a week's time.  Hypokalemia - Replace prior to discharge    Discharge Instructions  Discharge Instructions    Ambulatory referral to Neurology    Complete by:  As directed   An appointment is requested in approximately: 8 weeks   Call MD for:    Complete by:  As directed   Strokelike symptoms.   Diet - low sodium heart healthy    Complete by:  As directed   Increase activity slowly    Complete by:  As directed       Medication List    STOP taking these medications   aspirin EC 81 MG tablet     TAKE these medications   allopurinol 300 MG  tablet Commonly known as:  ZYLOPRIM Take 300 mg by mouth daily.   amLODipine 5 MG tablet Commonly known as:  NORVASC Take 5 mg by mouth every morning.   atenolol 50 MG tablet Commonly known as:  TENORMIN Take 50 mg by mouth every morning.   atorvastatin 20 MG tablet Commonly known as:  LIPITOR Take 1 tablet (20 mg total) by mouth daily at 6 PM.   cetirizine 10 MG  tablet Commonly known as:  ZYRTEC Take 10 mg by mouth daily.   clopidogrel 75 MG tablet Commonly known as:  PLAVIX Take 1 tablet (75 mg total) by mouth daily. Start taking on:  05/24/2016   hydrochlorothiazide 12.5 MG capsule Commonly known as:  MICROZIDE Take 12.5 mg by mouth daily.   hydroxypropyl methylcellulose / hypromellose 2.5 % ophthalmic solution Commonly known as:  ISOPTO TEARS / GONIOVISC Place 1 drop into both eyes 3 (three) times daily as needed for dry eyes.   omeprazole 20 MG capsule Commonly known as:  PRILOSEC Take 20 mg by mouth daily.      Follow-up Information    MACKENZIE,BRIAN, MD. Schedule an appointment as soon as possible for a visit in 1 week(s).   Specialty:  Internal Medicine Why:  To be seen with repeat labs (BMP). Contact information: [place], [place] [address] [phone]          No Known Allergies  Consultations:  Neurology   Procedures/Studies: Ct Head Wo Contrast  Result Date: 05/22/2016 CLINICAL DATA:  Slurred speech and generalized weakness for 2 days EXAM: CT HEAD WITHOUT CONTRAST TECHNIQUE: Contiguous axial images were obtained from the base of the skull through the vertex without intravenous contrast. COMPARISON:  None. FINDINGS: Brain: No evidence of acute infarction, hemorrhage, hydrocephalus, extra-axial collection or mass lesion/mass effect. Changes of mild atrophy and chronic white matter ischemic change are noted. Vascular: No hyperdense vessel or unexpected calcification. Skull: Normal. Negative for fracture or focal lesion. Sinuses/Orbits: No acute finding. IMPRESSION: Chronic atrophic and ischemic changes without acute abnormality. Electronically Signed   By: Inez Catalina M.D.   On: 05/22/2016 19:14   Mr [name] Head Wo Contrast  Result Date: 05/23/2016 CLINICAL DATA:  Pontine infarct.  Slurred speech EXAM: MRA HEAD WITHOUT CONTRAST TECHNIQUE: Angiographic images of the Circle of Willis were  obtained using MRA technique without intravenous contrast. COMPARISON:  Brain MRI 05/22/2016 FINDINGS: Intracranial internal carotid arteries: Normal. Anterior cerebral arteries: Normal. Middle cerebral arteries: Normal. Posterior communicating arteries: Not visualized bilaterally. Posterior cerebral arteries: There are focal high-grade stenoses of the P2 segments bilaterally. There is normal flow related enhancement distally. Basilar artery: Normal. Vertebral arteries: Left dominant. Normal. Superior cerebellar arteries: Normal. Anterior inferior cerebellar arteries: Normal. Posterior inferior cerebellar arteries: Normal. IMPRESSION: 1. No large vessel occlusion. 2. High-grade stenoses of the P2 segments of both posterior cerebral arteries, likely due to atherosclerotic disease. 3. Otherwise normal MRA of the circle of Willis. Electronically Signed   By: Ulyses Jarred M.D.   On: 05/23/2016 01:22   Mr Brain Wo Contrast  Result Date: 05/22/2016 CLINICAL DATA:  Dizziness and fatigue. Headache and slurred speech. Difficulty walking. EXAM: MRI HEAD WITHOUT CONTRAST TECHNIQUE: Multiplanar, multiecho pulse sequences of the brain and surrounding structures were obtained without intravenous contrast. COMPARISON:  Head CT 05/22/2016, brain MRI 12/09/2003 FINDINGS: Brain: There is focal diffusion restriction along the left central aspect of the pons. The midline structures are normal. There is multifocal hyperintense T2 weighted signal within the periventricular  white matter compatible with chronic microvascular disease. No evidence of acute hemorrhage. No mass lesion or midline shift. No hydrocephalus or extra-axial fluid collection. Vascular: Major intracranial flow voids are preserved. No evidence of chronic microhemorrhage or amyloid angiopathy. Skull and upper cervical spine: The visualized skull base, calvarium, upper cervical spine and extracranial soft tissues are normal. Sinuses/Orbits: No fluid levels or advanced  mucosal thickening. No mastoid effusion. Normal orbits. Other: None. IMPRESSION: 1. 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 2. No mass effect or acute hemorrhage. 3. Findings of chronic microvascular disease. These results were called by telephone at the time of interpretation on 05/22/2016 at 10:08 pm to Dr. Daleen Bo , who verbally acknowledged these results. Electronically Signed   By: Ulyses Jarred M.D.   On: 05/22/2016 22:08  2-D echo 05/23/16: Study Conclusions  - Left ventricle: The cavity size was normal. Wall thickness was   increased in a pattern of moderate LVH. There was mild focal   basal hypertrophy of the septum. Systolic function was normal.   The estimated ejection fraction was in the range of 55% to 60%.   Wall motion was normal; there were no regional wall motion   abnormalities. Doppler parameters are consistent with abnormal   left ventricular relaxation (grade 1 diastolic dysfunction). - Aortic valve: There was very mild stenosis. There was mild   regurgitation. Valve area (VTI): 1.87 cm^2. Valve area (Vmax):   1.82 cm^2. Valve area (Vmean): 1.47 cm^2.  Impressions:  - No cardiac source of emboli was indentified.    Subjective: Slurred speech is better. Denies any other complaints. Anxious to go home.   Discharge Exam:  Vitals:   05/23/16 0700 05/23/16 0900 05/23/16 1103 05/23/16 1434  BP: (!) 154/81 (!) 152/86 (!) 170/73 (!) 151/73  Pulse: (!) 54 62 (!) 58 (!) 53  Resp: 18 16 18 20   Temp: 98.3 F (36.8 C)   97.6 F (36.4 C)  TempSrc: Oral   Oral  SpO2: 99% 99% 100% 98%  Weight:      Height:        General: Pt lying comfortably in bed & appears in no obvious distress. Cardiovascular: S1 & S2 heard, RRR, S1/S2 +. No murmurs, rubs, gallops or clicks. No JVD or pedal edema.Telemetry: Sinus bradycardia in the 50s-sinus rhythm.  Respiratory: Clear to auscultation without  wheezing, rhonchi or crackles. No increased work of breathing. Abdominal:  Non distended, non tender & soft. No organomegaly or masses appreciated. Normal bowel sounds heard. CNS: Alert and oriented. Mild dysarthria which worsens with more speaking. No facial asymmetry or other cranial nerve deficits. Extremities: no edema, no cyanosis. Symmetric 5 x 5 power. No pronator drift.    The results of significant diagnostics from this hospitalization (including imaging, microbiology, ancillary and laboratory) are listed below for reference.     Microbiology: No results found for this or any previous visit (from the past 240 hour(s)).   Labs: BNP (last 3 results) No results for input(s): BNP in the last 8760 hours. Basic Metabolic Panel:  Recent Labs Lab 05/22/16 1829 05/22/16 1838  NA 137 140  K 3.4* 3.5  CL 100* 100*  CO2 25  --   GLUCOSE 95 94  BUN 12 14  CREATININE 1.28* 1.20  CALCIUM 9.5  --    Liver Function Tests:  Recent Labs Lab 05/22/16 1829  AST 45*  ALT 36  ALKPHOS 54  BILITOT 0.8  PROT 7.9  ALBUMIN 4.8   No results for input(s): LIPASE, AMYLASE in the last 168 hours.  Recent Labs Lab 05/22/16 2037  AMMONIA 27   CBC:  Recent Labs Lab 05/22/16 1829 05/22/16 1838  WBC 10.0  --   NEUTROABS 4.7  --   HGB 14.9 16.3  HCT 43.7 48.0  MCV 94.8  --   PLT 255  --    Cardiac Enzymes: No results for input(s): CKTOTAL, CKMB, CKMBINDEX, TROPONINI in the last 168 hours. BNP: Invalid input(s): POCBNP CBG:  Recent Labs Lab 05/22/16 1839 05/22/16 2056  GLUCAP 96 82   D-Dimer No results for input(s): DDIMER in the last 72 hours. Hgb A1c No results for input(s): HGBA1C in the last 72 hours. Lipid Profile  Recent Labs  05/23/16 0300  CHOL 197  HDL 30*  LDLCALC 120*  TRIG 236*  CHOLHDL 6.6    Time coordinating discharge: Over 30 minutes  SIGNED:  Vernell Leep, MD, FACP, FHM. [facility] Pager [phone]  If 7PM-7AM, please  contact night-coverage [email or web address] Password [facility] 05/23/2016, 4:43 PM

## 2016-05-23 NOTE — Care Management Note (Signed)
Case Management Note  Patient Details  Name: Thomas Bond MRN: [id number] Date of Birth: [birth year]  Subjective/Objective:  Pt admitted with CVA. He is from home with spouse.                  Action/Plan: No f/u per PT. OT recommending outpatient therapy. CM following for d/c needs.  Expected Discharge Date:                  Expected Discharge Plan:  Home/Self Care  In-House Referral:     Discharge planning Services     Post Acute Care Choice:    Choice offered to:     DME Arranged:    DME Agency:     HH Arranged:    HH Agency:     Status of Service:  In process, will continue to follow  If discussed at Long Length of Stay Meetings, dates discussed:    Additional Comments:  Pollie Friar, RN 05/23/2016, 3:12 PM

## 2016-05-23 NOTE — Discharge Instructions (Signed)
Ischemic Stroke Treated Without Warfarin °An ischemic stroke (cerebrovascular accident) is the sudden death of brain tissue. It is a medical emergency. An ischemic stroke can cause permanent loss of brain function. This can cause problems with different parts of your body. °CAUSES °An ischemic stroke is caused by a decrease of oxygen supply to an area of your brain. It is usually the result of a small blood clot (embolus) or collection of cholesterol or fat (plaque) that blocks blood flow in the brain. An ischemic stroke can also be caused by blocked or damaged carotid arteries. °RISK FACTORS °· High blood pressure (hypertension). °· High cholesterol. °· Diabetes mellitus. °· Heart disease. °· The buildup of plaque in the blood vessels (peripheral artery disease or atherosclerosis). °· The buildup of plaque in the blood vessels that provide blood and oxygen to the brain (carotid artery stenosis). °· An abnormal heart rhythm (atrial fibrillation). °· Obesity. °· Smoking cigarettes. °· Taking oral contraceptives, especially in combination with using tobacco. °· Physical inactivity. °· A diet that is high in fats, salt (sodium), and calories. °· Excessive alcohol use. °· Use of illegal drugs, especially cocaine and methamphetamine. °· Being African American. °· Being over the age of 55 years. °· Family history of stroke. °· Previous history of blood clots, stroke, TIA (transient ischemic attack), or heart attack. °· Sickle cell disease. °SIGNS AND SYMPTOMS °These symptoms usually develop suddenly, or you may notice them after waking up from sleep. Symptoms may include sudden: °· Weakness or numbness in your face, arm, or leg, especially on one side of your body. °· Confusion. °· Trouble speaking (aphasia) or understanding speech. °· Trouble seeing with one or both eyes. °· Trouble walking or difficulty moving your arms or legs. °· Dizziness. °· Loss of balance or coordination. °· Severe headache with no known cause.  The headache is often described as the worst headache ever experienced. °DIAGNOSIS °Your health care provider can often determine the presence or absence of an ischemic stroke based on your symptoms, history, and physical exam. CT (computed tomography) of the brain is usually performed to confirm the stroke, determine causes, and determine stroke severity. Other tests may be done to find the cause of the stroke. These tests may include: °· ECG (electrocardiogram). °· Continuous heart monitoring. °· Echocardiogram. °· Carotid ultrasound. °· MRI. °· A scan of the brain circulation. °· Blood tests. °TREATMENT °It is very important to seek treatment at the first sign of stroke symptoms. Your health care provider may perform the following treatments within 6 hours of the onset of stroke symptoms: °· Medicine to dissolve the blood clot (thrombolytic). °· Inserting a device into the affected artery to remove the blood clot. °These treatments may not be effective if too much time has passed since your stroke symptoms began. Even if you do not know when your symptoms began, get treatment as soon as possible. There are other treatment options that may be given, such as: °· Oxygen. °· IV fluids. °· Medicines to thin the blood (anticoagulants). °· A procedure to widen blocked arteries. °Your treatment will depend on how long you have had your symptoms, the severity of your symptoms, and the cause of your symptoms. °Your health care provider will take measures to prevent short-term and long-term complications of stroke, such as: °· Breathing foreign material into the lungs (aspiration pneumonia). °· Blood clots in the legs. °· Bedsores. °· Falls. °Medicines and dietary changes may be used to help treat and manage risk factors for   stroke, such as diabetes and high blood pressure. °If any of your body's functions were impaired by stroke, you may work with physical, speech, or occupational therapists to help you recover. °HOME CARE  INSTRUCTIONS °· Take medicines only as directed by your health care provider. Follow the directions carefully. Medicines may be used to control risk factors for a stroke. Be sure that you understand all your medicine instructions. °· If swallow studies have determined that your swallowing reflex is present, you should eat healthy foods. Foods may need to be a soft or pureed consistency, or you may need to take small bites in order to avoid aspirating or choking. °· Follow physical activity guidelines as directed by your health care team. °· Do not use any tobacco products, including cigarettes, chewing tobacco, or electronic cigarettes. If you smoke, quit. If you need help quitting, ask your health care provider. °· Limit or stop alcohol use. °· A safe home environment is important to reduce the risk of falls. Your health care provider may arrange for specialists to evaluate your home. Having grab bars in the bedroom and bathroom is often important. Your health care provider may arrange for equipment to be used at home, such as raised toilets and a seat for the shower. °· Ongoing physical, occupational, and speech therapy may be needed to maximize your recovery after a stroke. If you have been advised to use a walker or a cane, use it at all times. Be sure to keep your therapy appointments. °· Keep all follow-up visits with your health care provider. This is very important. This includes any referrals, therapy, rehabilitation, and lab tests. Proper follow-up can prevent another stroke from occurring. °PREVENTION °The risk of a stroke can be decreased by appropriately treating high blood pressure, high cholesterol, diabetes, heart disease, and obesity. It can also be decreased by quitting smoking, limiting alcohol, and staying physically active. °SEEK IMMEDIATE MEDICAL CARE IF: °· You have sudden weakness or numbness in your face, arm, or leg, especially on one side of your body. °· You have sudden confusion. °· You  have sudden trouble speaking (aphasia) or understanding. °· You have sudden trouble seeing with one or both eyes. °· You have sudden trouble walking or difficulty moving your arms or legs. °· You have sudden dizziness. °· You have a sudden loss of balance or coordination. °· You have a sudden, severe headache with no known cause. °· You have a partial or total loss of consciousness. °Any of these symptoms may represent a serious problem that is an emergency. Do not wait to see if the symptoms will go away. Get medical help right away. Call your local emergency services (911 in [place] Do not drive yourself to the hospital. °  °This information is not intended to replace advice given to you by your health care provider. Make sure you discuss any questions you have with your health care provider. °  °Document Released: 06/17/2014 Document Reviewed: 06/17/2014 °Elsevier Interactive Patient Education ©2016 [employer] ° °

## 2016-05-23 NOTE — Evaluation (Signed)
Occupational Therapy Evaluation Patient Details Name: Thomas Bond MRN: [id number] DOB: Mar 28, 1941 Today's Date: 05/23/2016    History of Present Illness 75 y.o.malewith medical history significant of HTN. Patient presents to ED for evaluation of slurred speech. He also has had generalized weakness and fatigue. MRI on 9/6 + for acute infarct within the left central aspect of the pons.   Clinical Impression   Pt reports he was independent with ADL PTA. Currently pt overall supervision for safety with ADL and functional mobility. Pt noted to have LOB x1 with higher level balance activity; able to self correct without physical assist. Pt presenting with mild memory deficits and decreased ability to follow multi step commands; ?baseline, no family present to confirm. Pt planning to d/c home with 24/7 supervision from family. At this time recommending outpatient OT for noted mild cognitive deficits. Pt would benefit from continued skilled OT to address established goals.   Follow Up Recommendations  Outpatient OT;Supervision/Assistance - 24 hour    Equipment Recommendations  None recommended by OT    Recommendations for Other Services       Precautions / Restrictions Precautions Precautions: None Restrictions Weight Bearing Restrictions: No      Mobility Bed Mobility Overal bed mobility: Modified Independent             General bed mobility comments: HOB flat without use of bed rail. Increased time required.  Transfers Overall transfer level: Needs assistance Equipment used: None Transfers: Sit to/from Stand Sit to Stand: Supervision         General transfer comment: Supervision for safety. No unsteadiness noted with static standing balance.    Balance Overall balance assessment: Needs assistance Sitting-balance support: Feet supported;No upper extremity supported Sitting balance-Leahy Scale: Normal     Standing balance support: No upper extremity  supported;During functional activity Standing balance-Leahy Scale: Fair Standing balance comment: LOB x1 with higher level balance activity                            ADL Overall ADL's : Needs assistance/impaired Eating/Feeding: Modified independent;Sitting   Grooming: Supervision/safety;Standing   Upper Body Bathing: Supervision/ safety;Sitting   Lower Body Bathing: Supervison/ safety;Sit to/from stand   Upper Body Dressing : Supervision/safety;Sitting   Lower Body Dressing: Supervision/safety;Sit to/from stand   Toilet Transfer: Supervision/safety;Ambulation;Regular Toilet   Toileting- Water quality scientist and Hygiene: Supervision/safety;Sit to/from stand   Tub/ Shower Transfer: Min guard;Ambulation Tub/Shower Transfer Details (indicate cue type and reason): Simulated in room with LOB x1; pt able to correct without physical assist. Functional mobility during ADLs: Supervision/safety General ADL Comments: Pt with LOB x1 during higher level balance functional task. Pt also with difficulty following multi step commands during session. Endorses that his only "problem" is his speech despite noticible balance deficits.     Vision Additional Comments: Appears WFL.   Perception     Praxis      Pertinent Vitals/Pain Pain Assessment: No/denies pain     Hand Dominance Right   Extremity/Trunk Assessment Upper Extremity Assessment Upper Extremity Assessment: Overall WFL for tasks assessed   Lower Extremity Assessment Lower Extremity Assessment: Defer to PT evaluation   Cervical / Trunk Assessment Cervical / Trunk Assessment: Kyphotic   Communication Communication Communication: No difficulties (slight slurred speech)   Cognition Arousal/Alertness: Awake/alert Behavior During Therapy: WFL for tasks assessed/performed Overall Cognitive Status: No family/caregiver present to determine baseline cognitive functioning Area of Impairment: Following  commands;Safety/judgement;Problem solving  Following Commands: Follows one step commands consistently;Follows multi-step commands inconsistently Safety/Judgement: Decreased awareness of deficits;Decreased awareness of safety   Problem Solving: Requires verbal cues General Comments: Pt with difficulty following multi step tasks during session. Reports "the only thing wrong with me is my head; I dont need PT".    General Comments       Exercises       Shoulder Instructions      Home Living Family/patient expects to be discharged to:: Private residence Living Arrangements: Spouse/significant other Available Help at Discharge: Family;Available PRN/intermittently Type of Home: House Home Access: Stairs to enter [employer] of Steps: 3-4 Entrance Stairs-Rails: Right Home Layout: One level     Bathroom Shower/Tub: Tub/shower unit;Walk-in shower (primarily uses tub) Shower/tub characteristics: Curtain Biochemist, clinical:  (one of each)     Home Equipment: Environmental consultant - 2 wheels;Walker - 4 wheels;Cane - single point;Bedside commode;Shower seat   Additional Comments: All equipment is wifes      Prior Functioning/Environment Level of Independence: Independent        Comments: drives, gardens, occasionally plays golf    OT Diagnosis: Cognitive deficits   OT Problem List: Impaired balance (sitting and/or standing);Decreased coordination;Decreased cognition;Decreased safety awareness   OT Treatment/Interventions: Self-care/ADL training;Neuromuscular education;DME and/or AE instruction;Therapeutic activities;Cognitive remediation/compensation;Patient/family education;Balance training    OT Goals(Current goals can be found in the care plan section) Acute Rehab OT Goals Patient Stated Goal: go home OT Goal Formulation: With patient Time For Goal Achievement: 06/06/16 Potential to Achieve Goals: Good ADL Goals Pt Will Perform Tub/Shower Transfer: with modified  independence;ambulating;Tub transfer Additional ADL Goal #1: Pt will follow 3 step command in a moderately distracting without verbal cues. Additional ADL Goal #2: Pt will perform UB/LB bathing and gather materials at mod I level.  OT Frequency: Min 2X/week   Barriers to D/C:            Co-evaluation              End of Session Equipment Utilized During Treatment: Gait belt Nurse Communication: Mobility status  Activity Tolerance: Patient tolerated treatment well Patient left: Other (comment) (with PT)   Time: [id number] OT Time Calculation (min): 14 min Charges:  OT General Charges $OT Visit: 1 Procedure OT Evaluation $OT Eval Moderate Complexity: 1 Procedure G-Codes:     Binnie Kand M.S., OTR/L Pager: [phone]  05/23/2016, 10:29 AM

## 2016-05-23 NOTE — Progress Notes (Signed)
  Echocardiogram 2D Echocardiogram has been performed.  Thomas Bond 05/23/2016, 1:13 PM

## 2016-05-23 NOTE — Progress Notes (Signed)
*  PRELIMINARY RESULTS* Vascular Ultrasound Carotid Duplex (Doppler) has been completed.  Preliminary findings: Bilateral: No significant (1-39%) ICA stenosis. Antegrade vertebral flow.   Landry Mellow, RDMS, RVT  05/23/2016, 12:16 PM

## 2016-05-24 LAB — VAS US CAROTID
LCCADDIAS: -17 cm/s
LCCADSYS: -76 cm/s
LCCAPDIAS: 19 cm/s
LEFT ECA DIAS: -11 cm/s
LEFT VERTEBRAL DIAS: 15 cm/s
LICADDIAS: -16 cm/s
LICAPDIAS: -22 cm/s
LICAPSYS: -81 cm/s
Left CCA prox sys: 72 cm/s
Left ICA dist sys: -69 cm/s
RCCAPSYS: 58 cm/s
RIGHT ECA DIAS: -7 cm/s
RIGHT VERTEBRAL DIAS: 12 cm/s
Right CCA prox dias: 14 cm/s
Right cca dist sys: -52 cm/s

## 2016-05-24 LAB — HEMOGLOBIN A1C
HEMOGLOBIN A1C: 5.4 % (ref 4.8–5.6)
MEAN PLASMA GLUCOSE: 108 mg/dL

## 2016-05-30 DIAGNOSIS — Z23 Encounter for immunization: Secondary | ICD-10-CM | POA: Diagnosis not present

## 2016-05-30 DIAGNOSIS — I6350 Cerebral infarction due to unspecified occlusion or stenosis of unspecified cerebral artery: Secondary | ICD-10-CM | POA: Diagnosis not present

## 2016-08-06 ENCOUNTER — Encounter: Payer: Self-pay | Admitting: Neurology

## 2016-08-06 ENCOUNTER — Ambulatory Visit (INDEPENDENT_AMBULATORY_CARE_PROVIDER_SITE_OTHER): Payer: Commercial Managed Care - HMO | Admitting: Neurology

## 2016-08-06 VITALS — BP 166/85 | HR 66 | Ht 66.0 in | Wt 193.0 lb

## 2016-08-06 DIAGNOSIS — I6381 Other cerebral infarction due to occlusion or stenosis of small artery: Secondary | ICD-10-CM

## 2016-08-06 DIAGNOSIS — I639 Cerebral infarction, unspecified: Secondary | ICD-10-CM

## 2016-08-06 NOTE — Progress Notes (Signed)
[facility] [address] [place]. [place] [address] [phone]       OFFICE FOLLOW-UP NOTE  [name] Date of Birth:  July 26, 1941 Medical Record Number:  [id number]   HPI: Mr Thomas Bond is a 65 year Caucasian male seen for first office f/u visit after [facility] admission for stroke in September 2017. Thomas Bond is an 75 y.o. male admitted yesterday through the emergency department for evaluation of slurred speech. Symptoms began 3 days prior to admission. 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 He has had fluctuating symptoms. He had some slurred speech but was able to speak clearly. He had no trouble understanding what other people for saying. His symptoms seemed to have improved since admission. On inquiry she admits to being slightly dizzy and off balance but he was able to walk without any assistance and did not have any falls. He denied any accompanying headache, double vision, blurred vision, focal extremity weakness or numbness. He denies any prior history of strokes or TIAs. He does have hypertension and sleep apnea but states he is compliant with his treatment. He does take aspirin every day. Date last known well: 05/21/16 Time last known well: 05/21/16 tPA Given: Out of treatment window at time of presentation. MRI scan of the brain showed acute infarct in the left central aspect of the pons and MRA showed no large vessel occlusion. There was high-grade stenosis of the P2 segment of both posterior cerebral arteries which was incidental. Hemoglobin A1c was 5.4. LDL cholesterol was elevated at 120, triglycerides 236 and total cholesterol 197. HDL was 30 mg percent. Carotid ultrasound showed no significant extracranial stenosis. Transthoracic echo showed normal ejection fraction without cardiac source of embolism. Patient was started on Plavix for stroke  prevention which he states is tolerating well without bleeding or bruising. He states his blood pressure fluctuates a little but it is usually well controlled. Today it is elevated in office at 166/85 and he blames this on white coat hypertension. His tolerating Lipitor without muscle aches or pains. Patient has a known diagnosis of sleep apnea but he has not gone for CPAP titration and states that he would not be able to tolerate it. He is refusing referral to sleep medicine consultant as well. He admits to drinking a lot of soda everyday. He has no residual severity from his stroke and assessment. And has made a full neurological recovery. ROS:   14 system review of systems is positive for  memory loss, hearing loss, itching and all other systems negative PMH:  Past Medical History:  Diagnosis Date  . GERD (gastroesophageal reflux disease)   . Gout   . Hypertension   . Sleep apnea 2013   told has sleep apnea afer sleep study, no follow up study done  . Stroke [facility])     Social History:  Social History   Social History  . Marital status: Married    Spouse name: N/A  . Number of children: N/A  . Years of education: N/A   Occupational History  . Not on file.   Social History Main Topics  . Smoking status: Never Smoker  . Smokeless tobacco: Never Used  . Alcohol use Yes     Comment: occasionally beer on weekends  . Drug use: No  . Sexual activity: Not on file   Other Topics Concern  . Not on file  Social History Narrative  . No narrative on file    Medications:   Current Outpatient Prescriptions on File Prior to Visit  Medication Sig Dispense Refill  . allopurinol (ZYLOPRIM) 300 MG tablet Take 300 mg by mouth daily.    Marland Kitchen amLODipine (NORVASC) 5 MG tablet Take 5 mg by mouth every morning.    Marland Kitchen atenolol (TENORMIN) 50 MG tablet Take 50 mg by mouth every morning.    Marland Kitchen atorvastatin (LIPITOR) 20 MG tablet Take 1 tablet (20 mg total) by mouth daily at 6 PM. 30 tablet 0  .  cetirizine (ZYRTEC) 10 MG tablet Take 10 mg by mouth daily.    . clopidogrel (PLAVIX) 75 MG tablet Take 1 tablet (75 mg total) by mouth daily. 30 tablet 0  . hydrochlorothiazide (MICROZIDE) 12.5 MG capsule Take 12.5 mg by mouth daily.    . hydroxypropyl methylcellulose / hypromellose (ISOPTO TEARS / GONIOVISC) 2.5 % ophthalmic solution Place 1 drop into both eyes 3 (three) times daily as needed for dry eyes.    Marland Kitchen omeprazole (PRILOSEC) 20 MG capsule Take 20 mg by mouth daily.     No current facility-administered medications on file prior to visit.     Allergies:  No Known Allergies  Physical Exam General: well developed, well nourished elderly Caucasian male, seated, in no evident distress Head: head normocephalic and atraumatic.  Neck: supple with no carotid or supraclavicular bruits Cardiovascular: regular rate and rhythm, no murmurs Musculoskeletal: no deformity Skin:  no rash/petichiae Vascular:  Normal pulses all extremities Vitals:   08/06/16 0905  BP: (!) 166/85  Pulse: 66   Neurologic Exam Mental Status: Awake and fully alert. Oriented to place and time. Recent and remote memory intact. Attention span, concentration and fund of knowledge appropriate. Mood and affect appropriate.  Cranial Nerves: Fundoscopic exam reveals sharp disc margins. Pupils equal, briskly reactive to light. Extraocular movements full without nystagmus. Visual fields full to confrontation. Hearing intact. Facial sensation intact. Face, tongue, palate moves normally and symmetrically.  Motor: Normal bulk and tone. Normal strength in all tested extremity muscles. Sensory.: intact to touch ,pinprick .position and vibratory sensation.  Coordination: Rapid alternating movements normal in all extremities. Finger-to-nose and heel-to-shin performed accurately bilaterally. Gait and Station: Arises from chair without difficulty. Stance is normal. Gait demonstrates normal stride length and balance . Able to heel, toe  and tandem walk with slight difficulty.  Reflexes: 1+ and symmetric. Toes downgoing.   NIHSS 0 Modified Rankin  0   ASSESSMENT: 67 year Occasionally male with pontine lacunar infarct in September 2017) doing clinically quite well. Vascular risk factors of hypertension, hyperlipidemia and sleep apnea.    PLAN: I had a long d/w patient about his recent stroke, risk for recurrent stroke/TIAs, personally independently reviewed imaging studies and stroke evaluation results and answered questions.Continue Plavix  for secondary stroke prevention and maintain strict control of hypertension with blood pressure goal below 130/90, diabetes with hemoglobin A1c goal below 6.5% and lipids with LDL cholesterol goal below 70 mg/dL. I also advised the patient to eat a healthy diet with plenty of whole grains, cereals, fruits and vegetables, exercise regularly and maintain ideal body weight .I also counseled the patient to start using CPAP for his sleep apnea but he is reluctant to do so and is refusing referral to see sleep medicine consultant. Followup in the future with my nurse practitioner in 6 months or call earlier if necessary. I also advised him to cut back his soda intake. Greater than  50% of time during this 25 minute visit was spent on counseling,explanation of diagnosis, planning of further management, discussion with patient and family and coordination of care Antony Contras, MD  [facility] [address] [place], [place] [address]  Phone [phone] Fax [phone] Note: This document was prepared with digital dictation and possible smart phrase technology. Any transcriptional errors that result from this process are unintentional

## 2016-08-06 NOTE — Patient Instructions (Addendum)
Stroke Prevention Some medical conditions and behaviors are associated with an increased chance of having a stroke. You may prevent a stroke by making healthy choices and managing medical conditions. How can I reduce my risk of having a stroke?  Stay physically active. Get at least 30 minutes of activity on most or all days.  Do not smoke. It may also be helpful to avoid exposure to secondhand smoke.  Limit alcohol use. Moderate alcohol use is considered to be:  No more than 2 drinks per day for men.  No more than 1 drink per day for nonpregnant women.  Eat healthy foods. This involves:  Eating 5 or more servings of fruits and vegetables a day.  Making dietary changes that address high blood pressure (hypertension), high cholesterol, diabetes, or obesity.  Manage your cholesterol levels.  Making food choices that are high in fiber and low in saturated fat, trans fat, and cholesterol may control cholesterol levels.  Take any prescribed medicines to control cholesterol as directed by your health care provider.  Manage your diabetes.  Controlling your carbohydrate and sugar intake is recommended to manage diabetes.  Take any prescribed medicines to control diabetes as directed by your health care provider.  Control your hypertension.  Making food choices that are low in salt (sodium), saturated fat, trans fat, and cholesterol is recommended to manage hypertension.  Ask your health care provider if you need treatment to lower your blood pressure. Take any prescribed medicines to control hypertension as directed by your health care provider.  If you are 18-39 years of age, have your blood pressure checked every 3-5 years. If you are 40 years of age or older, have your blood pressure checked every year.  Maintain a healthy weight.  Reducing calorie intake and making food choices that are low in sodium, saturated fat, trans fat, and cholesterol are recommended to manage  weight.  Stop drug abuse.  Avoid taking birth control pills.  Talk to your health care provider about the risks of taking birth control pills if you are over 35 years old, smoke, get migraines, or have ever had a blood clot.  Get evaluated for sleep disorders (sleep apnea).  Talk to your health care provider about getting a sleep evaluation if you snore a lot or have excessive sleepiness.  Take medicines only as directed by your health care provider.  For some people, aspirin or blood thinners (anticoagulants) are helpful in reducing the risk of forming abnormal blood clots that can lead to stroke. If you have the irregular heart rhythm of atrial fibrillation, you should be on a blood thinner unless there is a good reason you cannot take them.  Understand all your medicine instructions.  Make sure that other conditions (such as anemia or atherosclerosis) are addressed. Get help right away if:  You have sudden weakness or numbness of the face, arm, or leg, especially on one side of the body.  Your face or eyelid droops to one side.  You have sudden confusion.  You have trouble speaking (aphasia) or understanding.  You have sudden trouble seeing in one or both eyes.  You have sudden trouble walking.  You have dizziness.  You have a loss of balance or coordination.  You have a sudden, severe headache with no known cause.  You have new chest pain or an irregular heartbeat. Any of these symptoms may represent a serious problem that is an emergency. Do not wait to see if the symptoms will go away.   Get medical help at once. Call your local emergency services (911 in [place] Do not drive yourself to the hospital. This information is not intended to replace advice given to you by your health care provider. Make sure you discuss any questions you have with your health care provider. Document Released: 10/10/2004 Document Revised: 02/08/2016 Document Reviewed: 03/05/2013 Elsevier  Interactive Patient Education  2017 [employer] Inc.  

## 2016-12-05 DIAGNOSIS — I10 Essential (primary) hypertension: Secondary | ICD-10-CM | POA: Diagnosis not present

## 2016-12-05 DIAGNOSIS — R7309 Other abnormal glucose: Secondary | ICD-10-CM | POA: Diagnosis not present

## 2016-12-05 DIAGNOSIS — I129 Hypertensive chronic kidney disease with stage 1 through stage 4 chronic kidney disease, or unspecified chronic kidney disease: Secondary | ICD-10-CM | POA: Diagnosis not present

## 2016-12-05 DIAGNOSIS — M109 Gout, unspecified: Secondary | ICD-10-CM | POA: Diagnosis not present

## 2016-12-05 DIAGNOSIS — Z0000 Encounter for general adult medical examination without abnormal findings: Secondary | ICD-10-CM | POA: Diagnosis not present

## 2016-12-05 DIAGNOSIS — Z125 Encounter for screening for malignant neoplasm of prostate: Secondary | ICD-10-CM | POA: Diagnosis not present

## 2016-12-31 DIAGNOSIS — Z0000 Encounter for general adult medical examination without abnormal findings: Secondary | ICD-10-CM | POA: Diagnosis not present

## 2016-12-31 DIAGNOSIS — E7800 Pure hypercholesterolemia, unspecified: Secondary | ICD-10-CM | POA: Diagnosis not present

## 2016-12-31 DIAGNOSIS — I10 Essential (primary) hypertension: Secondary | ICD-10-CM | POA: Diagnosis not present

## 2016-12-31 DIAGNOSIS — E669 Obesity, unspecified: Secondary | ICD-10-CM | POA: Diagnosis not present

## 2017-02-06 ENCOUNTER — Encounter (INDEPENDENT_AMBULATORY_CARE_PROVIDER_SITE_OTHER): Payer: Self-pay

## 2017-02-06 ENCOUNTER — Encounter: Payer: Self-pay | Admitting: Nurse Practitioner

## 2017-02-06 ENCOUNTER — Ambulatory Visit (INDEPENDENT_AMBULATORY_CARE_PROVIDER_SITE_OTHER): Payer: Medicare HMO | Admitting: Nurse Practitioner

## 2017-02-06 VITALS — BP 140/72 | HR 65 | Wt 191.6 lb

## 2017-02-06 DIAGNOSIS — I10 Essential (primary) hypertension: Secondary | ICD-10-CM | POA: Diagnosis not present

## 2017-02-06 DIAGNOSIS — I639 Cerebral infarction, unspecified: Secondary | ICD-10-CM

## 2017-02-06 DIAGNOSIS — I6381 Other cerebral infarction due to occlusion or stenosis of small artery: Secondary | ICD-10-CM

## 2017-02-06 DIAGNOSIS — E785 Hyperlipidemia, unspecified: Secondary | ICD-10-CM

## 2017-02-06 NOTE — Patient Instructions (Signed)
Stressed the importance of management of risk factors to prevent further stroke Continue Plavix for secondary stroke prevention Maintain strict control of hypertension with blood pressure goal below 130/90, today's reading 140/72 continue antihypertensive medications Control of diabetes with hemoglobin A1c below 6.5 followed by primary care Cholesterol with LDL cholesterol less than 70, followed by primary care,  continue Lipitor  Exercise by walking, every day stay well hydrated in the summer  eat healthy diet with whole grains,  fresh fruits and vegetables For your OSA recommend that you use the CPAP  Discharge from the stroke clinic

## 2017-02-06 NOTE — Progress Notes (Signed)
I have reviewed and agreed above plan. 

## 2017-02-06 NOTE — Progress Notes (Signed)
[facility]  PATIENT: Thomas Bond DOB: 04/07/1941   REASON FOR VISIT: Follow-up for stroke lacunar infarct  HISTORY FROM: Patient    HISTORY OF PRESENT ILLNESS:Thomas Bond is a 67 year Caucasian male seen for first office f/u visit after [facility] admission for stroke in September 2017. Thomas Harkins Cunninghamis an 76 y.o.maleadmitted yesterday through the emergency department for evaluation of slurred speech. Symptoms began 3 days prior to admission. 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 He had some slurred speech but was able to speak clearly. He had no trouble understanding what other people for saying. His symptoms seemed to have improved since admission. On inquiry she admits to being slightly dizzy and off balance but he was able to walk without any assistance and did not have any falls. He denied any accompanying headache, double vision, blurred vision, focal extremity weakness or numbness. He denies any prior history of strokes or TIAs. He does have hypertension and sleep apnea but states he is compliant with his treatment. He does take aspirin every day. Date last known well: 05/21/16 Time last known well: 05/21/16 tPA Given:Out of treatment window at time of presentation. MRI scan of the brain showed acute infarct in the left central aspect of the pons and MRA showed no large vessel occlusion. There was high-grade stenosis of the P2 segment of both posterior cerebral arteries which was incidental. Hemoglobin A1c was 5.4. LDL cholesterol was elevated at 120, triglycerides 236 and total cholesterol 197. HDL was 30 mg percent. Carotid ultrasound showed no significant extracranial stenosis. Transthoracic echo showed normal ejection fraction without cardiac source of embolism. Patient was started on Plavix for stroke prevention which  he states is tolerating well without bleeding or bruising. He states his blood pressure fluctuates a little but it is usually well controlled. Today it is elevated in office at 166/85 and he blames this on white coat hypertension. His tolerating Lipitor without muscle aches or pains. Patient has a known diagnosis of sleep apnea but he has not gone for CPAP titration and states that he would not be able to tolerate it. He is refusing referral to sleep medicine consultant as well. He admits to drinking a lot of soda everyday. He has no residual severity from his stroke and assessment. And has made a full neurological recovery. UPDATE 05/24/2018CM Thomas. Santillana, 76 year old male returns for follow-up with history of lacunar infarct in September 2017. He is currently on Plavix for secondary stroke prevention without further stroke or TIA symptoms. He has no bruising and bleeding. Blood pressure in the office today 140/72. He is on Lipitor for hyperlipidemia labs followed by primary care. He denies any myalgias or muscle cramps. He is back to walking a mile every day. Patient has a history of obstructive sleep apnea but does not use CPAP. He was encouraged to drink more water since he just likes sodas. He has no new neurologic deficits. He returns for reevaluation  REVIEW OF SYSTEMS: Full 14 system review of systems performed and notable only for those listed, all others are neg:  Constitutional: neg  Cardiovascular: neg Ear/Nose/Throat: neg  Skin: neg Eyes: neg Respiratory: neg Gastroitestinal: neg  Hematology/Lymphatic: neg  Endocrine: neg Musculoskeletal:neg Allergy/Immunology: neg Neurological: neg Psychiatric: neg Sleep : neg   ALLERGIES: No Known Allergies  HOME MEDICATIONS: Outpatient Medications Prior to Visit  Medication Sig Dispense  Refill  . allopurinol (ZYLOPRIM) 300 MG tablet Take 300 mg by mouth daily.    Marland Kitchen amLODipine (NORVASC) 5 MG tablet Take 5 mg by mouth every morning.    Marland Kitchen  atenolol (TENORMIN) 50 MG tablet Take 50 mg by mouth every morning.    Marland Kitchen atorvastatin (LIPITOR) 20 MG tablet Take 1 tablet (20 mg total) by mouth daily at 6 PM. 30 tablet 0  . cetirizine (ZYRTEC) 10 MG tablet Take 10 mg by mouth daily.    . clopidogrel (PLAVIX) 75 MG tablet Take 1 tablet (75 mg total) by mouth daily. 30 tablet 0  . hydrochlorothiazide (MICROZIDE) 12.5 MG capsule Take 12.5 mg by mouth daily.    . hydroxypropyl methylcellulose / hypromellose (ISOPTO TEARS / GONIOVISC) 2.5 % ophthalmic solution Place 1 drop into both eyes 3 (three) times daily as needed for dry eyes.    Marland Kitchen omeprazole (PRILOSEC) 20 MG capsule Take 20 mg by mouth daily.     No facility-administered medications prior to visit.     PAST MEDICAL HISTORY: Past Medical History:  Diagnosis Date  . GERD (gastroesophageal reflux disease)   . Gout   . Hypertension   . Sleep apnea 2013   told has sleep apnea afer sleep study, no follow up study done  . Stroke [facility])     PAST SURGICAL HISTORY: Past Surgical History:  Procedure Laterality Date  . COLONOSCOPY WITH PROPOFOL N/A 10/10/2014   Procedure: COLONOSCOPY WITH PROPOFOL;  Surgeon: Garlan Fair, MD;  Location: [facility] ENDOSCOPY;  Service: Endoscopy;  Laterality: N/A;  . HERNIA REPAIR Bilateral 1984 and 1998    FAMILY HISTORY: Family History  Problem Relation Age of Onset  . Heart attack Brother   . Stroke Neg Hx     SOCIAL HISTORY: Social History   Social History  . Marital status: Married    Spouse name: N/A  . Number of children: N/A  . Years of education: N/A   Occupational History  . Not on file.   Social History Main Topics  . Smoking status: Never Smoker  . Smokeless tobacco: Never Used  . Alcohol use Yes     Comment: occasionally beer on weekends  . Drug use: No  . Sexual activity: Not on file   Other Topics Concern  . Not on file   Social History Narrative  . No narrative on file     PHYSICAL EXAM  Vitals:   02/06/17 1046    BP: 140/72  Pulse: 65  Weight: 191 lb 9.6 oz (86.9 kg)   Body mass index is 30.93 kg/m.  Generalized: Well developed, in no acute distress  Head: normocephalic and atraumatic,. Oropharynx benign  Neck: Supple, no carotid bruits  Cardiac: Regular rate rhythm, no murmur  Musculoskeletal: No deformity   Neurological examination   Mentation: Alert oriented to time, place, history taking. Attention span and concentration appropriate. Recent and remote memory intact.  Follows all commands speech and language fluent.   Cranial nerve II-XII: Pupils were equal round reactive to light extraocular movements were full, visual field were full on confrontational test. Facial sensation and strength were normal. hearing was intact to finger rubbing bilaterally. Uvula tongue midline. head turning and shoulder shrug were normal and symmetric.Tongue protrusion into cheek strength was normal. Motor: normal bulk and tone, full strength in the BUE, BLE, fine finger movements normal, no pronator drift. No focal weakness Sensory: normal and symmetric to light touch, pinprick, and  Vibration, in the upper and lower  extremities  Coordination: finger-nose-finger, heel-to-shin bilaterally, no dysmetria Reflexes: Symmetric upper and lower plantar responses were flexor bilaterally. Gait and Station: Rising up from seated position without assistance, normal stance,  moderate stride, good arm swing, smooth turning, able to perform tiptoe, and heel walking without difficulty. Tandem gait is steady  DIAGNOSTIC DATA (LABS, IMAGING, TESTING) - I reviewed patient records, labs, notes, testing and imaging myself where available.  Lab Results  Component Value Date   WBC 10.0 05/22/2016   HGB 16.3 05/22/2016   HCT 48.0 05/22/2016   MCV 94.8 05/22/2016   PLT 255 05/22/2016      Component Value Date/Time   NA 140 05/22/2016 1838   K 3.5 05/22/2016 1838   CL 100 (L) 05/22/2016 1838   CO2 25 05/22/2016 1829   GLUCOSE  94 05/22/2016 1838   BUN 14 05/22/2016 1838   CREATININE 1.20 05/22/2016 1838   CALCIUM 9.5 05/22/2016 1829   PROT 7.9 05/22/2016 1829   ALBUMIN 4.8 05/22/2016 1829   AST 45 (H) 05/22/2016 1829   ALT 36 05/22/2016 1829   ALKPHOS 54 05/22/2016 1829   BILITOT 0.8 05/22/2016 1829   GFRNONAA 52 (L) 05/22/2016 1829   GFRAA >60 05/22/2016 1829   Lab Results  Component Value Date   CHOL 197 05/23/2016   HDL 30 (L) 05/23/2016   LDLCALC 120 (H) 05/23/2016   TRIG 236 (H) 05/23/2016   CHOLHDL 6.6 05/23/2016   Lab Results  Component Value Date   HGBA1C 5.4 05/23/2016    ASSESSMENT AND PLAN 19 year Occasionally male with pontine lacunar infarct in September 2017) doing clinically quite well. Vascular risk factors of hypertension, hyperlipidemia and sleep apnea.The patient is a current patient of Dr. Leonie Man  who is out of the office today . This note is sent to the work in doctor.     Stressed the importance of management of risk factors to prevent further stroke Continue Plavix for secondary stroke prevention Maintain strict control of hypertension with blood pressure goal below 130/90, today's reading 140/72 continue antihypertensive medications Control of diabetes with hemoglobin A1c below 6.5 followed by primary care Cholesterol with LDL cholesterol less than 70, followed by primary care,  continue Lipitor  Exercise by walking, every day stay well hydrated in the summer  eat healthy diet with whole grains,  fresh fruits and vegetables For your OSA recommend that you use the CPAP  Discharge from the stroke clinic Dennie Bible, [facility], [facility], [facility] [address] [place], [place] [address] [phone]

## 2017-03-03 DIAGNOSIS — E039 Hypothyroidism, unspecified: Secondary | ICD-10-CM | POA: Diagnosis not present

## 2017-06-25 ENCOUNTER — Other Ambulatory Visit: Payer: Self-pay | Admitting: Otolaryngology

## 2017-06-25 DIAGNOSIS — H6123 Impacted cerumen, bilateral: Secondary | ICD-10-CM | POA: Diagnosis not present

## 2017-06-25 DIAGNOSIS — R1312 Dysphagia, oropharyngeal phase: Secondary | ICD-10-CM

## 2017-06-25 DIAGNOSIS — K219 Gastro-esophageal reflux disease without esophagitis: Secondary | ICD-10-CM | POA: Diagnosis not present

## 2017-06-25 DIAGNOSIS — J342 Deviated nasal septum: Secondary | ICD-10-CM | POA: Diagnosis not present

## 2017-06-25 DIAGNOSIS — H93293 Other abnormal auditory perceptions, bilateral: Secondary | ICD-10-CM | POA: Diagnosis not present

## 2017-06-25 DIAGNOSIS — R1310 Dysphagia, unspecified: Secondary | ICD-10-CM | POA: Diagnosis not present

## 2017-06-25 DIAGNOSIS — Z7289 Other problems related to lifestyle: Secondary | ICD-10-CM | POA: Diagnosis not present

## 2017-06-30 DIAGNOSIS — R790 Abnormal level of blood mineral: Secondary | ICD-10-CM | POA: Diagnosis not present

## 2017-06-30 DIAGNOSIS — E7800 Pure hypercholesterolemia, unspecified: Secondary | ICD-10-CM | POA: Diagnosis not present

## 2017-07-02 ENCOUNTER — Ambulatory Visit
Admission: RE | Admit: 2017-07-02 | Discharge: 2017-07-02 | Disposition: A | Discharge status: Home / Self Care | Payer: Commercial Managed Care - HMO | Source: Ambulatory Visit | Attending: Otolaryngology | Admitting: Otolaryngology

## 2017-07-02 DIAGNOSIS — R1310 Dysphagia, unspecified: Secondary | ICD-10-CM | POA: Diagnosis not present

## 2017-07-02 DIAGNOSIS — R1312 Dysphagia, oropharyngeal phase: Secondary | ICD-10-CM

## 2017-07-03 DIAGNOSIS — R413 Other amnesia: Secondary | ICD-10-CM | POA: Diagnosis not present

## 2017-07-03 DIAGNOSIS — Z23 Encounter for immunization: Secondary | ICD-10-CM | POA: Diagnosis not present

## 2017-07-03 DIAGNOSIS — R790 Abnormal level of blood mineral: Secondary | ICD-10-CM | POA: Diagnosis not present

## 2017-07-03 DIAGNOSIS — I10 Essential (primary) hypertension: Secondary | ICD-10-CM | POA: Diagnosis not present

## 2017-07-03 DIAGNOSIS — E7800 Pure hypercholesterolemia, unspecified: Secondary | ICD-10-CM | POA: Diagnosis not present

## 2017-07-03 DIAGNOSIS — R7309 Other abnormal glucose: Secondary | ICD-10-CM | POA: Diagnosis not present

## 2017-07-03 DIAGNOSIS — Z8673 Personal history of transient ischemic attack (TIA), and cerebral infarction without residual deficits: Secondary | ICD-10-CM | POA: Diagnosis not present

## 2017-07-03 DIAGNOSIS — E039 Hypothyroidism, unspecified: Secondary | ICD-10-CM | POA: Diagnosis not present

## 2017-07-03 DIAGNOSIS — G4733 Obstructive sleep apnea (adult) (pediatric): Secondary | ICD-10-CM | POA: Diagnosis not present

## 2017-08-21 ENCOUNTER — Ambulatory Visit: Payer: Medicare HMO | Admitting: Neurology

## 2017-08-21 ENCOUNTER — Encounter: Payer: Self-pay | Admitting: Neurology

## 2017-08-21 VITALS — BP 169/74 | HR 58 | Wt 192.0 lb

## 2017-08-21 DIAGNOSIS — G3184 Mild cognitive impairment, so stated: Secondary | ICD-10-CM | POA: Diagnosis not present

## 2017-08-21 NOTE — Patient Instructions (Signed)
I had a long discussion with the patient regarding his remote stroke as well as memory loss and mild cognitive impairment and risk for Alzheimer's given his family history and answered questions. Continue Plavix for stroke prevention with strict control of hypertension with blood pressure goal below 130/90, lipids with LDL cholesterol goal below 70 mg percent and diabetes with him about an A1c goal below 6.5%. I encouraged him to eat a healthy diet with lots of fruits, vegetables, whole gr and cereals and reactive and not gain weight. I also encouraged him to start taking fish oil and increase participation in cognitively challenging activities like solving crossword puzzles, playing sudoku and bridge. We also discussed memory compensation strategies. The patient may also consider possible participation in the [place] early dementia study if interested. He will be given information to review at home and decide. He will return for follow-up in 6 months or call earlier if necessary.  Memory Compensation Strategies  1. Use "WARM" strategy.  W= write it down  A= associate it  R= repeat it  M= make a mental note  2.   You can keep a Social worker.  Use a 3-ring notebook with sections for the following: calendar, important names and phone numbers,  medications, doctors' names/phone numbers, lists/reminders, and a section to journal what you did  each day.   3.    Use a calendar to write appointments down.  4.    Write yourself a schedule for the day.  This can be placed on the calendar or in a separate section of the Memory Notebook.  Keeping a  regular schedule can help memory.  5.    Use medication organizer with sections for each day or morning/evening pills.  You may need help loading it  6.    Keep a basket, or pegboard by the door.  Place items that you need to take out with you in the basket or on the pegboard.  You may also want to  include a message board for reminders.  7.    Use  sticky notes.  Place sticky notes with reminders in a place where the task is performed.  For example: " turn off the  stove" placed by the stove, "lock the door" placed on the door at eye level, " take your medications" on  the bathroom mirror or by the place where you normally take your medications.  8.    Use alarms/timers.  Use while cooking to remind yourself to check on food or as a reminder to take your medicine, or as a  reminder to make a call, or as a reminder to perform another task, etc.

## 2017-08-21 NOTE — Progress Notes (Signed)
[facility] [address] [place]. [place] [address] [phone]       OFFICE FOLLOW-UP NOTE  Thomas. Thomas Bond Date of Birth:  March 30, 1941 Medical Record Number:  [id number]   HPI: Thomas Bond is a 64 year Caucasian male seen for first office f/u visit after Thomas Bond admission for stroke in September 2017. Thomas Bond is an 76 y.o. male admitted yesterday through the emergency department for evaluation of slurred speech. Symptoms began 3 days prior to admission. 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 He has had fluctuating symptoms. He had some slurred speech but was able to speak clearly. He had no trouble understanding what other people for saying. His symptoms seemed to have improved since admission. On inquiry she admits to being slightly dizzy and off balance but he was able to walk without any assistance and did not have any falls. He denied any accompanying headache, double vision, blurred vision, focal extremity weakness or numbness. He denies any prior history of strokes or TIAs. He does have hypertension and sleep apnea but states he is compliant with his treatment. He does take aspirin every day. Date last known well: 05/21/16 Time last known well: 05/21/16 tPA Given: Out of treatment window at time of presentation. MRI scan of the brain showed acute infarct in the left central aspect of the pons and MRA showed no large vessel occlusion. There was high-grade stenosis of the P2 segment of both posterior cerebral arteries which was incidental. Hemoglobin A1c was 5.4. LDL cholesterol was elevated at 120, triglycerides 236 and total cholesterol 197. HDL was 30 mg percent. Carotid ultrasound showed no significant extracranial stenosis. Transthoracic echo showed normal ejection fraction without cardiac source of embolism. Patient was started on Plavix for stroke  prevention which he states is tolerating well without bleeding or bruising. He states his blood pressure fluctuates a little but it is usually well controlled. Today it is elevated in office at 166/85 and he blames this on white coat hypertension. His tolerating Lipitor without muscle aches or pains. Patient has a known diagnosis of sleep apnea but he has not gone for CPAP titration and states that he would not be able to tolerate it. He is refusing referral to sleep medicine consultant as well. He admits to drinking a lot of soda everyday. He has no residual severity from his stroke and assessment. And has made a full neurological recovery. Update 08/21/2017 :He returns for follow-up after last visit 6 months ago. He states is doing well from the stroke standpoint without recurrent stroke or TIA symptoms. He remains on Plavix which is tolerating well without bruising or bleeding. His blood pressure is well controlled and today it is 169/79. His tolerating Lipitor well without muscle aches and pains. He states his had long-standing mild memory difficulties for several years. Of late has noticed that is having word finding difficulties and remembering names particularly of his favorite Thomas Bond the team that he follows quite regularly. This is frustrating at times. He is worried about Alzheimer's since his mother had Alzheimer's dementia. Patient also stated that he gets emotional easily while watching her feel nervous and emotional scenes. He denies any inappropriate laughter. He does not parts of it in any cognitively challenging activities. ROS:   14 system review of systems is positive for  memory loss, trouble swallowing, itching, word finding difficulties nd all  other systems negative PMH:  Past Medical History:  Diagnosis Date  . GERD (gastroesophageal reflux disease)   . Gout   . Hypertension   . Sleep apnea 2013   told has sleep apnea afer sleep study, no follow up study done    . Stroke [facility])     Social History:  Social History   Socioeconomic History  . Marital status: Married    Spouse name: Not on file  . Number of children: Not on file  . Years of education: Not on file  . Highest education level: Not on file  Social Needs  . Financial resource strain: Not on file  . Food insecurity - worry: Not on file  . Food insecurity - inability: Not on file  . Transportation needs - medical: Not on file  . Transportation needs - non-medical: Not on file  Occupational History  . Not on file  Tobacco Use  . Smoking status: Never Smoker  . Smokeless tobacco: Never Used  Substance and Sexual Activity  . Alcohol use: Yes    Comment: occasionally beer on weekends  . Drug use: No  . Sexual activity: Not on file  Other Topics Concern  . Not on file  Social History Narrative  . Not on file    Medications:   Current Outpatient Medications on File Prior to Visit  Medication Sig Dispense Refill  . allopurinol (ZYLOPRIM) 300 MG tablet Take 300 mg by mouth daily.    Marland Kitchen amLODipine (NORVASC) 5 MG tablet Take 5 mg by mouth every morning.    Marland Kitchen atenolol (TENORMIN) 50 MG tablet Take 50 mg by mouth every morning.    Marland Kitchen atorvastatin (LIPITOR) 20 MG tablet Take 1 tablet (20 mg total) by mouth daily at 6 PM. 30 tablet 0  . cetirizine (ZYRTEC) 10 MG tablet Take 10 mg by mouth daily.    . clopidogrel (PLAVIX) 75 MG tablet Take 1 tablet (75 mg total) by mouth daily. 30 tablet 0  . hydrochlorothiazide (MICROZIDE) 12.5 MG capsule Take 12.5 mg by mouth daily.    . hydroxypropyl methylcellulose / hypromellose (ISOPTO TEARS / GONIOVISC) 2.5 % ophthalmic solution Place 1 drop into both eyes 3 (three) times daily as needed for dry eyes.    Marland Kitchen levothyroxine (SYNTHROID, LEVOTHROID) 25 MCG tablet     . omeprazole (PRILOSEC) 20 MG capsule Take 20 mg by mouth daily.    Marland Kitchen triamcinolone cream (KENALOG) 0.1 %      No current facility-administered medications on file prior to visit.      Allergies:  No Known Allergies  Physical Exam General: well developed, well nourished elderly Caucasian male, seated, in no evident distress Head: head normocephalic and atraumatic.  Neck: supple with no carotid or supraclavicular bruits Cardiovascular: regular rate and rhythm, no murmurs Musculoskeletal: no deformity Skin:  no rash/petichiae Vascular:  Normal pulses all extremities Vitals:   08/21/17 1512  BP: (!) 169/74  Pulse: (!) 58   Neurologic Exam Mental Status: Awake and fully alert. Oriented to place and time. Recent and remote memory intact. Attention span, concentration and fund of knowledge appropriate. Mood and affect appropriate. Mini-Mental status exam 26/30 with deficits in orientation attention and calculation. Clock drawing 3/4. Cranial Nerves: Fundoscopic exam reveals sharp disc margins. Pupils equal, briskly reactive to light. Extraocular movements full without nystagmus. Visual fields full to confrontation. Hearing intact. Facial sensation intact. Face, tongue, palate moves normally and symmetrically.  Motor: Normal bulk and tone. Normal strength in all tested  extremity muscles. Mild tremor of outstretched upper extremities left greater than right Sensory.: intact to touch ,pinprick .position and vibratory sensation.  Coordination: Rapid alternating movements normal in all extremities. Finger-to-nose and heel-to-shin performed accurately bilaterally. Gait and Station: Arises from chair without difficulty. Stance is normal. Gait demonstrates normal stride length and balance . Able to heel, toe and tandem walk with great difficulty.  Reflexes: 1+ and symmetric. Toes downgoing.       ASSESSMENT: 21 year Occasionally male with pontine lacunar infarct in September 2017) doing clinically quite well. Vascular risk factors of hypertension, hyperlipidemia and sleep apnea. Long-standing history of mild memory loss and cognitive impairment at risk for Alzheimer's given  family history    PLAN: I had a long discussion with the patient regarding his remote stroke as well as memory loss and mild cognitive impairment and risk for Alzheimer's given his family history and answered questions. Continue Plavix for stroke prevention with strict control of hypertension with blood pressure goal below 130/90, lipids with LDL cholesterol goal below 70 mg percent and diabetes with him about an A1c goal below 6.5%. I encouraged him to eat a healthy diet with lots of fruits, vegetables, whole gr and cereals and reactive and not gain weight. I also encouraged him to start taking fish oil and increase participation in cognitively challenging activities like solving crossword puzzles, playing sudoku and bridge. We also discussed memory compensation strategies. The patient may also consider possible participation in the [place] early dementia study if interested. He will be given information to review at home and decide. He will return for follow-up in 6 months or call earlier if necessary. Greater than 50% of time during this 25 minute visit was spent on counseling,explanation of diagnosis, planning of further management, discussion with patient and family and coordination of care Antony Contras, MD  [facility] [address] [place], [place] [address]  Phone [phone] Fax [phone] Note: This document was prepared with digital dictation and possible smart phrase technology. Any transcriptional errors that result from this process are unintentional

## 2017-11-19 ENCOUNTER — Other Ambulatory Visit (HOSPITAL_COMMUNITY): Payer: Self-pay | Admitting: Neurology

## 2017-11-19 DIAGNOSIS — Z8673 Personal history of transient ischemic attack (TIA), and cerebral infarction without residual deficits: Secondary | ICD-10-CM | POA: Diagnosis not present

## 2017-11-19 DIAGNOSIS — K219 Gastro-esophageal reflux disease without esophagitis: Secondary | ICD-10-CM | POA: Diagnosis not present

## 2017-11-19 DIAGNOSIS — R1312 Dysphagia, oropharyngeal phase: Secondary | ICD-10-CM | POA: Diagnosis not present

## 2017-11-19 DIAGNOSIS — Z86010 Personal history of colonic polyps: Secondary | ICD-10-CM | POA: Diagnosis not present

## 2017-11-20 ENCOUNTER — Other Ambulatory Visit (HOSPITAL_COMMUNITY): Payer: Self-pay | Admitting: Gastroenterology

## 2017-11-20 DIAGNOSIS — R1310 Dysphagia, unspecified: Secondary | ICD-10-CM

## 2017-11-26 ENCOUNTER — Other Ambulatory Visit (HOSPITAL_COMMUNITY): Payer: Self-pay

## 2017-11-26 ENCOUNTER — Ambulatory Visit (HOSPITAL_COMMUNITY): Payer: Commercial Managed Care - HMO

## 2017-11-27 ENCOUNTER — Ambulatory Visit (HOSPITAL_COMMUNITY)
Admission: RE | Admit: 2017-11-27 | Discharge: 2017-11-27 | Disposition: A | Discharge status: Home / Self Care | Payer: Medicare HMO | Source: Ambulatory Visit | Attending: Gastroenterology | Admitting: Gastroenterology

## 2017-11-27 DIAGNOSIS — R1319 Other dysphagia: Secondary | ICD-10-CM | POA: Insufficient documentation

## 2017-11-27 DIAGNOSIS — K219 Gastro-esophageal reflux disease without esophagitis: Secondary | ICD-10-CM | POA: Diagnosis not present

## 2017-11-27 DIAGNOSIS — R1314 Dysphagia, pharyngoesophageal phase: Secondary | ICD-10-CM | POA: Insufficient documentation

## 2017-11-27 DIAGNOSIS — R1312 Dysphagia, oropharyngeal phase: Secondary | ICD-10-CM | POA: Insufficient documentation

## 2017-11-27 DIAGNOSIS — R1313 Dysphagia, pharyngeal phase: Secondary | ICD-10-CM | POA: Diagnosis not present

## 2017-11-27 DIAGNOSIS — R1310 Dysphagia, unspecified: Secondary | ICD-10-CM | POA: Diagnosis not present

## 2017-12-24 DIAGNOSIS — Z9849 Cataract extraction status, unspecified eye: Secondary | ICD-10-CM | POA: Diagnosis not present

## 2018-01-01 DIAGNOSIS — N182 Chronic kidney disease, stage 2 (mild): Secondary | ICD-10-CM | POA: Diagnosis not present

## 2018-01-01 DIAGNOSIS — I10 Essential (primary) hypertension: Secondary | ICD-10-CM | POA: Diagnosis not present

## 2018-01-01 DIAGNOSIS — R739 Hyperglycemia, unspecified: Secondary | ICD-10-CM | POA: Diagnosis not present

## 2018-01-01 DIAGNOSIS — Z125 Encounter for screening for malignant neoplasm of prostate: Secondary | ICD-10-CM | POA: Diagnosis not present

## 2018-01-06 DIAGNOSIS — K219 Gastro-esophageal reflux disease without esophagitis: Secondary | ICD-10-CM | POA: Diagnosis not present

## 2018-01-06 DIAGNOSIS — Z0000 Encounter for general adult medical examination without abnormal findings: Secondary | ICD-10-CM | POA: Diagnosis not present

## 2018-01-06 DIAGNOSIS — E7800 Pure hypercholesterolemia, unspecified: Secondary | ICD-10-CM | POA: Diagnosis not present

## 2018-01-06 DIAGNOSIS — M109 Gout, unspecified: Secondary | ICD-10-CM | POA: Diagnosis not present

## 2018-01-06 DIAGNOSIS — I10 Essential (primary) hypertension: Secondary | ICD-10-CM | POA: Diagnosis not present

## 2018-01-06 DIAGNOSIS — R790 Abnormal level of blood mineral: Secondary | ICD-10-CM | POA: Diagnosis not present

## 2018-01-06 DIAGNOSIS — E039 Hypothyroidism, unspecified: Secondary | ICD-10-CM | POA: Diagnosis not present

## 2018-01-06 DIAGNOSIS — R7309 Other abnormal glucose: Secondary | ICD-10-CM | POA: Diagnosis not present

## 2018-01-08 DIAGNOSIS — Z961 Presence of intraocular lens: Secondary | ICD-10-CM | POA: Diagnosis not present

## 2018-01-08 DIAGNOSIS — H26493 Other secondary cataract, bilateral: Secondary | ICD-10-CM | POA: Diagnosis not present

## 2018-01-21 DIAGNOSIS — H26492 Other secondary cataract, left eye: Secondary | ICD-10-CM | POA: Diagnosis not present

## 2018-02-19 DIAGNOSIS — L218 Other seborrheic dermatitis: Secondary | ICD-10-CM | POA: Diagnosis not present

## 2018-02-19 DIAGNOSIS — L570 Actinic keratosis: Secondary | ICD-10-CM | POA: Diagnosis not present

## 2018-02-19 DIAGNOSIS — L309 Dermatitis, unspecified: Secondary | ICD-10-CM | POA: Diagnosis not present

## 2018-07-20 DIAGNOSIS — E7800 Pure hypercholesterolemia, unspecified: Secondary | ICD-10-CM | POA: Diagnosis not present

## 2018-07-20 DIAGNOSIS — R790 Abnormal level of blood mineral: Secondary | ICD-10-CM | POA: Diagnosis not present

## 2018-07-20 DIAGNOSIS — R7309 Other abnormal glucose: Secondary | ICD-10-CM | POA: Diagnosis not present

## 2018-07-28 DIAGNOSIS — R7309 Other abnormal glucose: Secondary | ICD-10-CM | POA: Diagnosis not present

## 2018-07-28 DIAGNOSIS — Z23 Encounter for immunization: Secondary | ICD-10-CM | POA: Diagnosis not present

## 2018-07-28 DIAGNOSIS — R790 Abnormal level of blood mineral: Secondary | ICD-10-CM | POA: Diagnosis not present

## 2018-07-28 DIAGNOSIS — F329 Major depressive disorder, single episode, unspecified: Secondary | ICD-10-CM | POA: Diagnosis not present

## 2018-07-28 DIAGNOSIS — E039 Hypothyroidism, unspecified: Secondary | ICD-10-CM | POA: Diagnosis not present

## 2018-07-28 DIAGNOSIS — E7800 Pure hypercholesterolemia, unspecified: Secondary | ICD-10-CM | POA: Diagnosis not present

## 2018-07-28 DIAGNOSIS — E876 Hypokalemia: Secondary | ICD-10-CM | POA: Diagnosis not present

## 2018-07-28 DIAGNOSIS — I10 Essential (primary) hypertension: Secondary | ICD-10-CM | POA: Diagnosis not present

## 2018-08-20 DIAGNOSIS — F329 Major depressive disorder, single episode, unspecified: Secondary | ICD-10-CM | POA: Diagnosis not present

## 2019-01-07 DIAGNOSIS — I10 Essential (primary) hypertension: Secondary | ICD-10-CM | POA: Diagnosis not present

## 2019-01-07 DIAGNOSIS — E039 Hypothyroidism, unspecified: Secondary | ICD-10-CM | POA: Diagnosis not present

## 2019-01-07 DIAGNOSIS — Z0000 Encounter for general adult medical examination without abnormal findings: Secondary | ICD-10-CM | POA: Diagnosis not present

## 2019-01-07 DIAGNOSIS — R7309 Other abnormal glucose: Secondary | ICD-10-CM | POA: Diagnosis not present

## 2019-01-14 DIAGNOSIS — E039 Hypothyroidism, unspecified: Secondary | ICD-10-CM | POA: Diagnosis not present

## 2019-01-14 DIAGNOSIS — I10 Essential (primary) hypertension: Secondary | ICD-10-CM | POA: Diagnosis not present

## 2019-01-14 DIAGNOSIS — Z0000 Encounter for general adult medical examination without abnormal findings: Secondary | ICD-10-CM | POA: Diagnosis not present

## 2019-01-14 DIAGNOSIS — R7309 Other abnormal glucose: Secondary | ICD-10-CM | POA: Diagnosis not present

## 2019-01-14 DIAGNOSIS — E7800 Pure hypercholesterolemia, unspecified: Secondary | ICD-10-CM | POA: Diagnosis not present

## 2019-03-09 DIAGNOSIS — H6123 Impacted cerumen, bilateral: Secondary | ICD-10-CM | POA: Diagnosis not present

## 2019-03-09 DIAGNOSIS — K219 Gastro-esophageal reflux disease without esophagitis: Secondary | ICD-10-CM | POA: Diagnosis not present

## 2019-03-09 DIAGNOSIS — D170 Benign lipomatous neoplasm of skin and subcutaneous tissue of head, face and neck: Secondary | ICD-10-CM | POA: Diagnosis not present

## 2019-07-12 DIAGNOSIS — E7800 Pure hypercholesterolemia, unspecified: Secondary | ICD-10-CM | POA: Diagnosis not present

## 2019-07-12 DIAGNOSIS — R7309 Other abnormal glucose: Secondary | ICD-10-CM | POA: Diagnosis not present

## 2019-07-15 DIAGNOSIS — K219 Gastro-esophageal reflux disease without esophagitis: Secondary | ICD-10-CM | POA: Diagnosis not present

## 2019-07-15 DIAGNOSIS — E039 Hypothyroidism, unspecified: Secondary | ICD-10-CM | POA: Diagnosis not present

## 2019-07-15 DIAGNOSIS — I10 Essential (primary) hypertension: Secondary | ICD-10-CM | POA: Diagnosis not present

## 2019-07-15 DIAGNOSIS — Z23 Encounter for immunization: Secondary | ICD-10-CM | POA: Diagnosis not present

## 2019-07-15 DIAGNOSIS — E7800 Pure hypercholesterolemia, unspecified: Secondary | ICD-10-CM | POA: Diagnosis not present

## 2019-07-15 DIAGNOSIS — R7309 Other abnormal glucose: Secondary | ICD-10-CM | POA: Diagnosis not present

## 2019-10-13 DIAGNOSIS — L299 Pruritus, unspecified: Secondary | ICD-10-CM | POA: Diagnosis not present

## 2019-10-13 DIAGNOSIS — Z7289 Other problems related to lifestyle: Secondary | ICD-10-CM | POA: Diagnosis not present

## 2019-10-13 DIAGNOSIS — H938X3 Other specified disorders of ear, bilateral: Secondary | ICD-10-CM | POA: Diagnosis not present

## 2019-10-25 ENCOUNTER — Ambulatory Visit: Payer: Medicare HMO

## 2019-11-12 ENCOUNTER — Ambulatory Visit: Payer: Medicare HMO

## 2019-11-12 ENCOUNTER — Ambulatory Visit: Payer: Medicare HMO | Attending: Internal Medicine

## 2019-11-12 DIAGNOSIS — Z23 Encounter for immunization: Secondary | ICD-10-CM | POA: Insufficient documentation

## 2019-11-12 NOTE — Progress Notes (Signed)
   Covid-19 Vaccination Clinic  Name:  Thomas Bond    MRN: [id number] DOB: 07-24-1941  11/12/2019  [name] was observed post Covid-19 immunization for 15 minutes without incidence. He was provided with Vaccine Information Sheet and instruction to access the V-Safe system.   [name] was instructed to call 911 with any severe reactions post vaccine: Marland Kitchen Difficulty breathing  . Swelling of your face and throat  . A fast heartbeat  . A bad rash all over your body  . Dizziness and weakness    Immunizations Administered    Name Date Dose VIS Date Route   Pfizer COVID-19 Vaccine 11/12/2019  1:59 PM 0.3 mL 08/27/2019 Intramuscular   Manufacturer: [place]   Lot: [id number]   [place]: [id number]

## 2019-12-08 ENCOUNTER — Ambulatory Visit: Payer: Medicare HMO | Attending: Internal Medicine

## 2019-12-08 DIAGNOSIS — Z23 Encounter for immunization: Secondary | ICD-10-CM

## 2019-12-08 NOTE — Progress Notes (Signed)
   Covid-19 Vaccination Clinic  Name:  BRETON ROMINE    MRN: [id number] DOB: September 01, 1941  12/08/2019  Mr. Dacres was observed post Covid-19 immunization for 15 minutes without incident. He was provided with Vaccine Information Sheet and instruction to access the V-Safe system.   Mr. Penta was instructed to call 911 with any severe reactions post vaccine: Marland Kitchen Difficulty breathing  . Swelling of face and throat  . A fast heartbeat  . A bad rash all over body  . Dizziness and weakness   Immunizations Administered    Name Date Dose VIS Date Route   Pfizer COVID-19 Vaccine 12/08/2019  1:19 PM 0.3 mL 08/27/2019 Intramuscular   Manufacturer: [place]   Lot: [id number]   [place]: [id number]

## 2020-01-17 DIAGNOSIS — N182 Chronic kidney disease, stage 2 (mild): Secondary | ICD-10-CM | POA: Diagnosis not present

## 2020-01-17 DIAGNOSIS — I10 Essential (primary) hypertension: Secondary | ICD-10-CM | POA: Diagnosis not present

## 2020-01-17 DIAGNOSIS — R7309 Other abnormal glucose: Secondary | ICD-10-CM | POA: Diagnosis not present

## 2020-01-17 DIAGNOSIS — R739 Hyperglycemia, unspecified: Secondary | ICD-10-CM | POA: Diagnosis not present

## 2020-01-17 DIAGNOSIS — E039 Hypothyroidism, unspecified: Secondary | ICD-10-CM | POA: Diagnosis not present

## 2020-01-20 DIAGNOSIS — Z0000 Encounter for general adult medical examination without abnormal findings: Secondary | ICD-10-CM | POA: Diagnosis not present

## 2020-01-20 DIAGNOSIS — R7301 Impaired fasting glucose: Secondary | ICD-10-CM | POA: Diagnosis not present

## 2020-01-20 DIAGNOSIS — E7800 Pure hypercholesterolemia, unspecified: Secondary | ICD-10-CM | POA: Diagnosis not present

## 2020-01-20 DIAGNOSIS — E039 Hypothyroidism, unspecified: Secondary | ICD-10-CM | POA: Diagnosis not present

## 2020-01-20 DIAGNOSIS — I10 Essential (primary) hypertension: Secondary | ICD-10-CM | POA: Diagnosis not present

## 2020-05-09 DIAGNOSIS — C44311 Basal cell carcinoma of skin of nose: Secondary | ICD-10-CM | POA: Diagnosis not present

## 2020-05-09 DIAGNOSIS — D485 Neoplasm of uncertain behavior of skin: Secondary | ICD-10-CM | POA: Diagnosis not present

## 2020-05-09 DIAGNOSIS — L821 Other seborrheic keratosis: Secondary | ICD-10-CM | POA: Diagnosis not present

## 2020-05-31 DIAGNOSIS — R52 Pain, unspecified: Secondary | ICD-10-CM | POA: Diagnosis not present

## 2020-05-31 DIAGNOSIS — Z20822 Contact with and (suspected) exposure to covid-19: Secondary | ICD-10-CM | POA: Diagnosis not present

## 2020-05-31 DIAGNOSIS — R5383 Other fatigue: Secondary | ICD-10-CM | POA: Diagnosis not present

## 2020-05-31 DIAGNOSIS — R0981 Nasal congestion: Secondary | ICD-10-CM | POA: Diagnosis not present

## 2020-06-05 DIAGNOSIS — R42 Dizziness and giddiness: Secondary | ICD-10-CM | POA: Diagnosis not present

## 2020-06-05 DIAGNOSIS — T148XXA Other injury of unspecified body region, initial encounter: Secondary | ICD-10-CM | POA: Diagnosis not present

## 2020-06-05 DIAGNOSIS — M791 Myalgia, unspecified site: Secondary | ICD-10-CM | POA: Diagnosis not present

## 2020-06-12 DIAGNOSIS — R52 Pain, unspecified: Secondary | ICD-10-CM | POA: Diagnosis not present

## 2020-06-12 DIAGNOSIS — R5383 Other fatigue: Secondary | ICD-10-CM | POA: Diagnosis not present

## 2020-06-26 DIAGNOSIS — D1801 Hemangioma of skin and subcutaneous tissue: Secondary | ICD-10-CM | POA: Diagnosis not present

## 2020-06-26 DIAGNOSIS — L578 Other skin changes due to chronic exposure to nonionizing radiation: Secondary | ICD-10-CM | POA: Diagnosis not present

## 2020-06-26 DIAGNOSIS — L821 Other seborrheic keratosis: Secondary | ICD-10-CM | POA: Diagnosis not present

## 2020-06-26 DIAGNOSIS — B078 Other viral warts: Secondary | ICD-10-CM | POA: Diagnosis not present

## 2020-06-26 DIAGNOSIS — L570 Actinic keratosis: Secondary | ICD-10-CM | POA: Diagnosis not present

## 2020-06-26 DIAGNOSIS — D225 Melanocytic nevi of trunk: Secondary | ICD-10-CM | POA: Diagnosis not present

## 2020-06-26 DIAGNOSIS — L814 Other melanin hyperpigmentation: Secondary | ICD-10-CM | POA: Diagnosis not present

## 2020-06-29 DIAGNOSIS — C44311 Basal cell carcinoma of skin of nose: Secondary | ICD-10-CM | POA: Diagnosis not present

## 2020-07-20 DIAGNOSIS — R7309 Other abnormal glucose: Secondary | ICD-10-CM | POA: Diagnosis not present

## 2020-07-20 DIAGNOSIS — M791 Myalgia, unspecified site: Secondary | ICD-10-CM | POA: Diagnosis not present

## 2020-07-20 DIAGNOSIS — R5383 Other fatigue: Secondary | ICD-10-CM | POA: Diagnosis not present

## 2020-07-20 DIAGNOSIS — E7800 Pure hypercholesterolemia, unspecified: Secondary | ICD-10-CM | POA: Diagnosis not present

## 2020-07-20 DIAGNOSIS — R7301 Impaired fasting glucose: Secondary | ICD-10-CM | POA: Diagnosis not present

## 2020-07-20 DIAGNOSIS — R739 Hyperglycemia, unspecified: Secondary | ICD-10-CM | POA: Diagnosis not present

## 2020-07-26 DIAGNOSIS — I129 Hypertensive chronic kidney disease with stage 1 through stage 4 chronic kidney disease, or unspecified chronic kidney disease: Secondary | ICD-10-CM | POA: Diagnosis not present

## 2020-07-26 DIAGNOSIS — R5383 Other fatigue: Secondary | ICD-10-CM | POA: Diagnosis not present

## 2020-07-26 DIAGNOSIS — E7800 Pure hypercholesterolemia, unspecified: Secondary | ICD-10-CM | POA: Diagnosis not present

## 2020-07-26 DIAGNOSIS — Z23 Encounter for immunization: Secondary | ICD-10-CM | POA: Diagnosis not present

## 2020-07-26 DIAGNOSIS — M791 Myalgia, unspecified site: Secondary | ICD-10-CM | POA: Diagnosis not present

## 2020-07-26 DIAGNOSIS — E039 Hypothyroidism, unspecified: Secondary | ICD-10-CM | POA: Diagnosis not present

## 2020-12-01 DIAGNOSIS — L905 Scar conditions and fibrosis of skin: Secondary | ICD-10-CM | POA: Diagnosis not present

## 2020-12-01 DIAGNOSIS — Z85828 Personal history of other malignant neoplasm of skin: Secondary | ICD-10-CM | POA: Diagnosis not present

## 2020-12-01 DIAGNOSIS — L570 Actinic keratosis: Secondary | ICD-10-CM | POA: Diagnosis not present

## 2021-01-24 DIAGNOSIS — E039 Hypothyroidism, unspecified: Secondary | ICD-10-CM | POA: Diagnosis not present

## 2021-01-24 DIAGNOSIS — Z0000 Encounter for general adult medical examination without abnormal findings: Secondary | ICD-10-CM | POA: Diagnosis not present

## 2021-01-24 DIAGNOSIS — N182 Chronic kidney disease, stage 2 (mild): Secondary | ICD-10-CM | POA: Diagnosis not present

## 2021-01-24 DIAGNOSIS — R739 Hyperglycemia, unspecified: Secondary | ICD-10-CM | POA: Diagnosis not present

## 2021-01-24 DIAGNOSIS — R7309 Other abnormal glucose: Secondary | ICD-10-CM | POA: Diagnosis not present

## 2021-01-24 DIAGNOSIS — I10 Essential (primary) hypertension: Secondary | ICD-10-CM | POA: Diagnosis not present

## 2021-01-26 DIAGNOSIS — E039 Hypothyroidism, unspecified: Secondary | ICD-10-CM | POA: Diagnosis not present

## 2021-01-26 DIAGNOSIS — R739 Hyperglycemia, unspecified: Secondary | ICD-10-CM | POA: Diagnosis not present

## 2021-01-26 DIAGNOSIS — R7309 Other abnormal glucose: Secondary | ICD-10-CM | POA: Diagnosis not present

## 2021-01-26 DIAGNOSIS — Z0000 Encounter for general adult medical examination without abnormal findings: Secondary | ICD-10-CM | POA: Diagnosis not present

## 2021-01-26 DIAGNOSIS — I350 Nonrheumatic aortic (valve) stenosis: Secondary | ICD-10-CM | POA: Diagnosis not present

## 2021-01-26 DIAGNOSIS — E7800 Pure hypercholesterolemia, unspecified: Secondary | ICD-10-CM | POA: Diagnosis not present

## 2021-01-31 DIAGNOSIS — I10 Essential (primary) hypertension: Secondary | ICD-10-CM | POA: Diagnosis not present

## 2021-01-31 DIAGNOSIS — R7309 Other abnormal glucose: Secondary | ICD-10-CM | POA: Diagnosis not present

## 2021-01-31 DIAGNOSIS — F329 Major depressive disorder, single episode, unspecified: Secondary | ICD-10-CM | POA: Diagnosis not present

## 2021-01-31 DIAGNOSIS — K219 Gastro-esophageal reflux disease without esophagitis: Secondary | ICD-10-CM | POA: Diagnosis not present

## 2021-01-31 DIAGNOSIS — G4733 Obstructive sleep apnea (adult) (pediatric): Secondary | ICD-10-CM | POA: Diagnosis not present

## 2021-01-31 DIAGNOSIS — Z0000 Encounter for general adult medical examination without abnormal findings: Secondary | ICD-10-CM | POA: Diagnosis not present

## 2021-01-31 DIAGNOSIS — J45901 Unspecified asthma with (acute) exacerbation: Secondary | ICD-10-CM | POA: Diagnosis not present

## 2021-03-12 DIAGNOSIS — J45909 Unspecified asthma, uncomplicated: Secondary | ICD-10-CM | POA: Diagnosis not present

## 2021-03-12 DIAGNOSIS — R062 Wheezing: Secondary | ICD-10-CM | POA: Diagnosis not present

## 2021-03-21 ENCOUNTER — Other Ambulatory Visit: Payer: Self-pay | Admitting: Internal Medicine

## 2021-03-21 DIAGNOSIS — R9389 Abnormal findings on diagnostic imaging of other specified body structures: Secondary | ICD-10-CM

## 2021-08-01 DIAGNOSIS — J45901 Unspecified asthma with (acute) exacerbation: Secondary | ICD-10-CM | POA: Diagnosis not present

## 2021-08-01 DIAGNOSIS — Z23 Encounter for immunization: Secondary | ICD-10-CM | POA: Diagnosis not present

## 2021-08-01 DIAGNOSIS — K219 Gastro-esophageal reflux disease without esophagitis: Secondary | ICD-10-CM | POA: Diagnosis not present

## 2021-08-01 DIAGNOSIS — R1310 Dysphagia, unspecified: Secondary | ICD-10-CM | POA: Diagnosis not present

## 2021-08-01 DIAGNOSIS — R9389 Abnormal findings on diagnostic imaging of other specified body structures: Secondary | ICD-10-CM | POA: Diagnosis not present

## 2021-08-01 DIAGNOSIS — J309 Allergic rhinitis, unspecified: Secondary | ICD-10-CM | POA: Diagnosis not present

## 2021-08-08 DIAGNOSIS — J309 Allergic rhinitis, unspecified: Secondary | ICD-10-CM | POA: Diagnosis not present

## 2021-08-08 DIAGNOSIS — J45901 Unspecified asthma with (acute) exacerbation: Secondary | ICD-10-CM | POA: Diagnosis not present

## 2021-08-08 DIAGNOSIS — R9389 Abnormal findings on diagnostic imaging of other specified body structures: Secondary | ICD-10-CM | POA: Diagnosis not present

## 2021-08-08 DIAGNOSIS — R1310 Dysphagia, unspecified: Secondary | ICD-10-CM | POA: Diagnosis not present

## 2021-08-08 DIAGNOSIS — K219 Gastro-esophageal reflux disease without esophagitis: Secondary | ICD-10-CM | POA: Diagnosis not present

## 2021-08-15 DIAGNOSIS — D225 Melanocytic nevi of trunk: Secondary | ICD-10-CM | POA: Diagnosis not present

## 2021-08-15 DIAGNOSIS — Z85828 Personal history of other malignant neoplasm of skin: Secondary | ICD-10-CM | POA: Diagnosis not present

## 2021-08-15 DIAGNOSIS — L570 Actinic keratosis: Secondary | ICD-10-CM | POA: Diagnosis not present

## 2021-08-15 DIAGNOSIS — Z08 Encounter for follow-up examination after completed treatment for malignant neoplasm: Secondary | ICD-10-CM | POA: Diagnosis not present

## 2021-08-15 DIAGNOSIS — L821 Other seborrheic keratosis: Secondary | ICD-10-CM | POA: Diagnosis not present

## 2021-08-15 DIAGNOSIS — L814 Other melanin hyperpigmentation: Secondary | ICD-10-CM | POA: Diagnosis not present

## 2021-08-17 ENCOUNTER — Encounter: Payer: Self-pay | Admitting: Pulmonary Disease

## 2021-08-17 ENCOUNTER — Ambulatory Visit: Payer: Medicare HMO | Admitting: Pulmonary Disease

## 2021-08-17 ENCOUNTER — Other Ambulatory Visit: Payer: Self-pay

## 2021-08-17 VITALS — BP 116/72 | HR 57 | Ht 66.0 in | Wt 162.8 lb

## 2021-08-17 DIAGNOSIS — R1310 Dysphagia, unspecified: Secondary | ICD-10-CM

## 2021-08-17 DIAGNOSIS — R0602 Shortness of breath: Secondary | ICD-10-CM | POA: Diagnosis not present

## 2021-08-17 MED ORDER — FLUTICASONE FUROATE-VILANTEROL 100-25 MCG/ACT IN AEPB: 1.0000 | INHALATION_SPRAY | Freq: Every day | RESPIRATORY_TRACT | 3 refills | Status: DC

## 2021-08-17 NOTE — Patient Instructions (Addendum)
Follow up in 8 weeks for breathing tests   We will follow up your CT Chest scan from next week  Start breo ellipta 1 puff daily over the next 2 months and monitor for improvement in your breathing.

## 2021-08-17 NOTE — Progress Notes (Signed)
Synopsis: Referred in December 2022 for abnormal chest x-ray by Holland Commons, FNP  Subjective:   PATIENT ID: Thomas Bond GENDER: male DOB: 1940-12-29, MRN: [id number]   HPI  Chief Complaint  Patient presents with   Consult    Referred by PCP for abnormal CT scan. States he has been more SOB lately and he lost his voice about 2 weeks. Denies any throat pain.    Thomas Bond is an 80 year old male, never smoker with GERD, hypertension, dysphagia and history of stroke who is referred to pulmonary clinic for abnormal chest x-ray.   Patient reports having shortness of breath over the past couple of years.  He has been tried on Asmanex as needed which he did not notice much improvement in his symptoms.  He has issues with dysphagia and has been evaluated by speech therapy multiple times over the past years.  Modified barium swallow from 2019 which shows mild pharyngeal cervical esophageal dysphagia due to mechanical obstruction from anterior cervical spine.  No aspiration observed and trace laryngeal penetration of thin liquids from vallecular residuals spilling into open airway cleared with further swallow.  Epiglottic deflection decreased likely due to cervical spine protrusion into pharynx which contributes to pharyngeal residuals.  He denies any issues with frequent pneumonias over the past years.  He denies any cough but does report wheezing.  He does report hoarseness in his voice which has been more constant over recent months.  He denies any seasonal allergies.  He denies any sinus congestion or postnasal drainage.  He is a never smoker but is exposed to significant secondhand smoke growing up as his parents smoked in the household.  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  He complains that his feet feel swollen in the morning time but when he checks them they do not appear swollen.  Past Medical History:   Diagnosis Date   GERD (gastroesophageal reflux disease)    Gout    Hypertension    Sleep apnea 2013   told has sleep apnea afer sleep study, no follow up study done   Stroke [facility])      Family History  Problem Relation Age of Onset   Heart attack Brother    Stroke Neg Hx      Social History   Socioeconomic History   Marital status: Married    Spouse name: Not on file   Number of children: Not on file   Years of education: Not on file   Highest education level: Not on file  Occupational History   Not on file  Tobacco Use   Smoking status: Never   Smokeless tobacco: Never  Substance and Sexual Activity   Alcohol use: Yes    Comment: occasionally beer on weekends   Drug use: No   Sexual activity: Not on file  Other Topics Concern   Not on file  Social History Narrative   Not on file   Social Determinants of Health   Financial Resource Strain: Not on file  Food Insecurity: Not on file  Transportation Needs: Not on file  Physical Activity: Not on file  Stress: Not on file  Social Connections: Not on file  Intimate Partner Violence: Not on file     No Known Allergies   Outpatient Medications Prior to Visit  Medication Sig Dispense Refill   allopurinol (ZYLOPRIM) 300 MG tablet Take 1 tablet by mouth daily.  amLODipine (NORVASC) 5 MG tablet Take 1 tablet by mouth daily.     atenolol (TENORMIN) 50 MG tablet Take 1 tablet by mouth daily.     clopidogrel (PLAVIX) 75 MG tablet Take 1 tablet by mouth daily.     hydrochlorothiazide (MICROZIDE) 12.5 MG capsule Take 1 capsule by mouth daily.     levothyroxine (SYNTHROID) 25 MCG tablet Take 1 tablet by mouth daily.     omeprazole (PRILOSEC) 20 MG capsule Take 1 capsule by mouth 2 (two) times daily.     sertraline (ZOLOFT) 50 MG tablet 1 tablet     allopurinol (ZYLOPRIM) 300 MG tablet Take 300 mg by mouth daily.     amLODipine (NORVASC) 5 MG tablet Take 5 mg by mouth every morning.     atenolol (TENORMIN) 50 MG tablet  Take 50 mg by mouth every morning.     atorvastatin (LIPITOR) 20 MG tablet Take 1 tablet (20 mg total) by mouth daily at 6 PM. 30 tablet 0   cetirizine (ZYRTEC) 10 MG tablet Take 10 mg by mouth daily.     clopidogrel (PLAVIX) 75 MG tablet Take 1 tablet (75 mg total) by mouth daily. 30 tablet 0   hydrochlorothiazide (MICROZIDE) 12.5 MG capsule Take 12.5 mg by mouth daily.     hydroxypropyl methylcellulose / hypromellose (ISOPTO TEARS / GONIOVISC) 2.5 % ophthalmic solution Place 1 drop into both eyes 3 (three) times daily as needed for dry eyes.     levothyroxine (SYNTHROID, LEVOTHROID) 25 MCG tablet      omeprazole (PRILOSEC) 20 MG capsule Take 20 mg by mouth daily.     triamcinolone cream (KENALOG) 0.1 %      No facility-administered medications prior to visit.    Review of Systems  Constitutional:  Negative for chills, fever, malaise/fatigue and weight loss.  HENT:  Negative for congestion, sinus pain and sore throat.        Difficulty swallowing  Eyes: Negative.   Respiratory:  Positive for cough and shortness of breath. Negative for hemoptysis, sputum production and wheezing.   Cardiovascular:  Negative for chest pain, palpitations, orthopnea, claudication and leg swelling.  Gastrointestinal:  Negative for abdominal pain, heartburn, nausea and vomiting.  Genitourinary: Negative.   Musculoskeletal:  Negative for joint pain and myalgias.  Skin:  Negative for rash.  Neurological:  Negative for weakness.  Endo/Heme/Allergies: Negative.   Psychiatric/Behavioral: Negative.       Objective:   Vitals:   08/17/21 1418  BP: 116/72  Pulse: (!) 57  SpO2: 98%  Weight: 162 lb 12.8 oz (73.8 kg)  Height: 5\' 6"  (1.676 m)     Physical Exam Constitutional:      General: He is not in acute distress. HENT:     Head: Normocephalic and atraumatic.  Eyes:     Extraocular Movements: Extraocular movements intact.     Conjunctiva/sclera: Conjunctivae normal.     Pupils: Pupils are equal,  round, and reactive to light.  Cardiovascular:     Rate and Rhythm: Normal rate and regular rhythm.     Pulses: Normal pulses.     Heart sounds: Normal heart sounds. No murmur heard. Pulmonary:     Breath sounds: Rales (Bibasilar) present.  Abdominal:     General: Bowel sounds are normal.     Palpations: Abdomen is soft.  Musculoskeletal:     Right lower leg: No edema.     Left lower leg: No edema.  Lymphadenopathy:     Cervical: No cervical  adenopathy.  Skin:    General: Skin is warm and dry.  Neurological:     General: No focal deficit present.     Mental Status: He is alert.  Psychiatric:        Mood and Affect: Mood normal.        Behavior: Behavior normal.        Thought Content: Thought content normal.        Judgment: Judgment normal.      CBC    Component Value Date/Time   WBC 10.0 05/22/2016 1829   RBC 4.61 05/22/2016 1829   HGB 16.3 05/22/2016 1838   HCT 48.0 05/22/2016 1838   PLT 255 05/22/2016 1829   MCV 94.8 05/22/2016 1829   MCH 32.3 05/22/2016 1829   MCHC 34.1 05/22/2016 1829   RDW 14.1 05/22/2016 1829   LYMPHSABS 3.8 05/22/2016 1829   MONOABS 0.9 05/22/2016 1829   EOSABS 0.6 05/22/2016 1829   BASOSABS 0.1 05/22/2016 1829   BMP Latest Ref Rng & Units 05/22/2016 05/22/2016  Glucose 65 - 99 mg/dL 94 95  BUN 6 - 20 mg/dL 14 12  Creatinine 0.61 - 1.24 mg/dL 1.20 1.28(H)  Sodium 135 - 145 mmol/L 140 137  Potassium 3.5 - 5.1 mmol/L 3.5 3.4(L)  Chloride 101 - 111 mmol/L 100(L) 100(L)  CO2 22 - 32 mmol/L - 25  Calcium 8.9 - 10.3 mg/dL - 9.5   Chest imaging:  PFT: No flowsheet data found.  Labs:  Path:  Echo:  Heart Catheterization:  Assessment & Plan:   Shortness of breath - Plan: Pulmonary Function Test, fluticasone furoate-vilanterol (BREO ELLIPTA) 100-25 MCG/ACT AEPB  Dysphagia, unspecified type  Discussion: Thomas Bond is an 80 year old male, never smoker with GERD, hypertension, dysphagia and history of stroke who is referred to  pulmonary clinic for abnormal chest x-ray.   Patient has exertional shortness of breath and bibasilar Rales concerning for possible interstitial lung disease in setting of chronic dysphagia with risk for aspiration leading to ongoing inflammation and reactive airways disease.  We will trial patient on Breo Ellipta 1 puff daily.  He can continue to use albuterol as needed.  We will check pulmonary function tests at follow-up visit in 2 months.  He is scheduled for high-resolution CT chest scan on 08/24/2021 which we will follow-up.  Freda Jackson, MD [facility] Office: [phone]   Current Outpatient Medications:    allopurinol (ZYLOPRIM) 300 MG tablet, Take 1 tablet by mouth daily., Disp: , Rfl:    amLODipine (NORVASC) 5 MG tablet, Take 1 tablet by mouth daily., Disp: , Rfl:    atenolol (TENORMIN) 50 MG tablet, Take 1 tablet by mouth daily., Disp: , Rfl:    clopidogrel (PLAVIX) 75 MG tablet, Take 1 tablet by mouth daily., Disp: , Rfl:    fluticasone furoate-vilanterol (BREO ELLIPTA) 100-25 MCG/ACT AEPB, Inhale 1 puff into the lungs daily., Disp: 28 each, Rfl: 3   hydrochlorothiazide (MICROZIDE) 12.5 MG capsule, Take 1 capsule by mouth daily., Disp: , Rfl:    levothyroxine (SYNTHROID) 25 MCG tablet, Take 1 tablet by mouth daily., Disp: , Rfl:    omeprazole (PRILOSEC) 20 MG capsule, Take 1 capsule by mouth 2 (two) times daily., Disp: , Rfl:    sertraline (ZOLOFT) 50 MG tablet, 1 tablet, Disp: , Rfl:

## 2021-08-24 ENCOUNTER — Ambulatory Visit
Admission: RE | Admit: 2021-08-24 | Discharge: 2021-08-24 | Disposition: A | Discharge status: Home / Self Care | Payer: Medicare HMO | Source: Ambulatory Visit | Attending: Internal Medicine | Admitting: Internal Medicine

## 2021-08-24 DIAGNOSIS — I2510 Atherosclerotic heart disease of native coronary artery without angina pectoris: Secondary | ICD-10-CM | POA: Diagnosis not present

## 2021-08-24 DIAGNOSIS — R9389 Abnormal findings on diagnostic imaging of other specified body structures: Secondary | ICD-10-CM

## 2021-08-24 DIAGNOSIS — J479 Bronchiectasis, uncomplicated: Secondary | ICD-10-CM | POA: Diagnosis not present

## 2021-08-24 DIAGNOSIS — I700 Atherosclerosis of aorta: Secondary | ICD-10-CM | POA: Diagnosis not present

## 2021-08-24 DIAGNOSIS — R918 Other nonspecific abnormal finding of lung field: Secondary | ICD-10-CM | POA: Diagnosis not present

## 2021-09-26 ENCOUNTER — Other Ambulatory Visit: Payer: Self-pay | Admitting: Physician Assistant

## 2021-09-26 DIAGNOSIS — R1310 Dysphagia, unspecified: Secondary | ICD-10-CM | POA: Diagnosis not present

## 2021-09-26 DIAGNOSIS — R0602 Shortness of breath: Secondary | ICD-10-CM | POA: Diagnosis not present

## 2021-09-26 DIAGNOSIS — R634 Abnormal weight loss: Secondary | ICD-10-CM | POA: Diagnosis not present

## 2021-09-26 DIAGNOSIS — R221 Localized swelling, mass and lump, neck: Secondary | ICD-10-CM | POA: Diagnosis not present

## 2021-09-26 DIAGNOSIS — Z86010 Personal history of colonic polyps: Secondary | ICD-10-CM | POA: Diagnosis not present

## 2021-09-26 DIAGNOSIS — D649 Anemia, unspecified: Secondary | ICD-10-CM | POA: Diagnosis not present

## 2021-10-02 ENCOUNTER — Ambulatory Visit
Admission: RE | Admit: 2021-10-02 | Discharge: 2021-10-02 | Disposition: A | Discharge status: Home / Self Care | Payer: Medicare HMO | Source: Ambulatory Visit | Attending: Physician Assistant | Admitting: Physician Assistant

## 2021-10-02 DIAGNOSIS — R1310 Dysphagia, unspecified: Secondary | ICD-10-CM

## 2021-10-02 DIAGNOSIS — K224 Dyskinesia of esophagus: Secondary | ICD-10-CM | POA: Diagnosis not present

## 2021-10-03 DIAGNOSIS — R634 Abnormal weight loss: Secondary | ICD-10-CM | POA: Diagnosis not present

## 2021-10-04 ENCOUNTER — Encounter: Payer: Self-pay | Admitting: Cardiology

## 2021-10-04 DIAGNOSIS — I2510 Atherosclerotic heart disease of native coronary artery without angina pectoris: Secondary | ICD-10-CM | POA: Insufficient documentation

## 2021-10-04 DIAGNOSIS — I700 Atherosclerosis of aorta: Secondary | ICD-10-CM | POA: Insufficient documentation

## 2021-10-04 NOTE — Progress Notes (Signed)
Cardiology Office Note   Date:  10/05/2021   ID:  Thomas Bond, Thomas Bond 15-Oct-1940, MRN [id number]  PCP:  Holland Commons, FNP  Cardiologist:   Minus Breeding, MD Referring:  Holland Commons, FNP  Chief Complaint  Patient presents with   Coronary Artery Disease      History of Present Illness: Thomas Bond is a 81 y.o. male who presents for evaluation of a mildly dilated thoracic aorta.  Coronary disease noted on the CT.  The patient was having some trouble breathing this summer which led to the CT.  The results of the have a calcium as noted.  I have reviewed these records for this appointment.  CT suggested thickening of the pericardium with pericardial calcification.  There was aortic atherosclerosis.  There is also calcification in the great vessels of the mediastinum and the coronary arteries.  This appears to be all 3 vessels.  There was calcification of the aorta.  The patient has no past cardiac history.  He thinks he had a stress test years ago when he was living in [place].  He did have a stroke in 2017 and I looked through those records.  He had a well-preserved ejection fraction at that point.  There was no mention of any valvular abnormalities.  He has no significant carotid stenosis.  He does not recall any residual from this.  He has chronic dyspnea.  He is actually due to see a pulmonologist soon.  He is going to have pulmonary function testing.  He gets short of breath walking a moderate distance on level ground.  He does not describe PND or orthopnea.  He does not have chest pressure, neck or arm discomfort.  He has no weight gain or edema.  Past Medical History:  Diagnosis Date   Aortic atherosclerosis ([place])    GERD (gastroesophageal reflux disease)    Gout    Hypertension    Sleep apnea 2013   Not CPAP   TIA     Past Surgical History:  Procedure Laterality Date   COLONOSCOPY WITH PROPOFOL N/A 10/10/2014   Procedure: COLONOSCOPY WITH PROPOFOL;   Surgeon: Garlan Fair, MD;  Location: [facility];  Service: Endoscopy;  Laterality: N/A;   HERNIA REPAIR Bilateral 1984 and 1998     Current Outpatient Medications  Medication Sig Dispense Refill   allopurinol (ZYLOPRIM) 300 MG tablet Take 1 tablet by mouth daily.     amLODipine (NORVASC) 5 MG tablet Take 1 tablet by mouth daily.     atenolol (TENORMIN) 50 MG tablet Take 1 tablet by mouth daily.     cetirizine (ZYRTEC) 10 MG tablet 1 tablet     clopidogrel (PLAVIX) 75 MG tablet Take 1 tablet by mouth daily.     famotidine (PEPCID) 20 MG tablet Take 20 mg by mouth daily.     fluticasone furoate-vilanterol (BREO ELLIPTA) 100-25 MCG/ACT AEPB Inhale 1 puff into the lungs daily. 28 each 3   hydrochlorothiazide (MICROZIDE) 12.5 MG capsule Take 1 capsule by mouth daily.     levothyroxine (SYNTHROID) 25 MCG tablet Take 1 tablet by mouth daily.     omeprazole (PRILOSEC) 20 MG capsule Take 1 capsule by mouth 2 (two) times daily.     sertraline (ZOLOFT) 50 MG tablet 1 tablet     No current facility-administered medications for this visit.    Allergies:   Patient has no known allergies.    Social History:  The patient  reports that  he has never smoked. He has never used smokeless tobacco. He reports current alcohol use. He reports that he does not use drugs.   Family History:  The patient's family history includes Alzheimer's disease in his mother; Heart attack (age of onset: 56) in his brother.    ROS:  Please see the history of present illness.   Otherwise, review of systems are positive for none.   All other systems are reviewed and negative.    PHYSICAL EXAM: VS:  BP (!) 158/62    Pulse (!) 56    Ht 5\' 6"  (1.676 m)    Wt 163 lb (73.9 kg)    SpO2 96%    BMI 26.31 kg/m  , BMI Body mass index is 26.31 kg/m. GENERAL:  Well appearing HEENT:  Pupils equal round and reactive, fundi not visualized, oral mucosa unremarkable NECK:  No jugular venous distention, waveform within normal  limits, carotid upstroke brisk and symmetric, no bruits, no thyromegaly.  Right sided neck mass.   LYMPHATICS:  No cervical, inguinal adenopathy LUNGS:  Clear to auscultation bilaterally BACK:  No CVA tenderness CHEST:  Unremarkable HEART:  PMI not displaced or sustained,S1 and S2 within normal limits, no S3, no S4, no clicks, no rubs, no murmurs ABD:  Flat, positive bowel sounds normal in frequency in pitch, no bruits, no rebound, no guarding, no midline pulsatile mass, no hepatomegaly, no splenomegaly EXT:  2 plus pulses throughout, no edema, no cyanosis no clubbing SKIN:  No rashes no nodules NEURO:  Cranial nerves II through XII grossly intact, motor grossly intact throughout PSYCH:  Cognitively intact, oriented to person place and time   EKG:  EKG is ordered today. The ekg ordered today demonstrates sinus rhythm, rate 52, axis leftward, intervals within normal limits, no acute ST-T wave changes.   Recent Labs: No results found for requested labs within last 8760 hours.    Lipid Panel    Component Value Date/Time   CHOL 197 05/23/2016 0300   TRIG 236 (H) 05/23/2016 0300   HDL 30 (L) 05/23/2016 0300   CHOLHDL 6.6 05/23/2016 0300   VLDL 47 (H) 05/23/2016 0300   LDLCALC 120 (H) 05/23/2016 0300      Wt Readings from Last 3 Encounters:  10/05/21 163 lb (73.9 kg)  08/17/21 162 lb 12.8 oz (73.8 kg)  08/21/17 192 lb (87.1 kg)      Other studies Reviewed: Additional studies/ records that were reviewed today include: Previous hospitalization records, CT, primary care records. Review of the above records demonstrates:  Please see elsewhere in the note.     ASSESSMENT AND PLAN:  CAD: Patient has coronary calcium noted.  Will have a low threshold for stress testing in the future pending his pulmonary work-up if there is no clear etiology for his shortness of breath.  However, not strongly expecting obstructive coronary disease as the etiology.  Aortic atherosclerosis: He will  be managed with aggressive risk reduction.  CKD: Creatinine is 1.35.  This can be followed.  Pericardial calcification:   The patient will have an echocardiogram and a BNP level to evaluate his shortness of breath.    Current medicines are reviewed at length with the patient today.  The patient does not have concerns regarding medicines.  The following changes have been made:  no change  Labs/ tests ordered today include: None  Orders Placed This Encounter  Procedures   Brain natriuretic peptide   ECHOCARDIOGRAM COMPLETE     Disposition:   FU  with me in about six weeks.      Signed, Minus Breeding, MD  10/05/2021 6:13 PM    [facility]

## 2021-10-05 ENCOUNTER — Other Ambulatory Visit: Payer: Self-pay

## 2021-10-05 ENCOUNTER — Ambulatory Visit: Payer: Medicare HMO | Admitting: Cardiology

## 2021-10-05 ENCOUNTER — Encounter: Payer: Self-pay | Admitting: Cardiology

## 2021-10-05 VITALS — BP 158/62 | HR 56 | Ht 66.0 in | Wt 163.0 lb

## 2021-10-05 DIAGNOSIS — I700 Atherosclerosis of aorta: Secondary | ICD-10-CM

## 2021-10-05 DIAGNOSIS — I10 Essential (primary) hypertension: Secondary | ICD-10-CM

## 2021-10-05 DIAGNOSIS — R0602 Shortness of breath: Secondary | ICD-10-CM | POA: Diagnosis not present

## 2021-10-05 DIAGNOSIS — I2510 Atherosclerotic heart disease of native coronary artery without angina pectoris: Secondary | ICD-10-CM

## 2021-10-05 NOTE — Patient Instructions (Signed)
Medication Instructions:  No changes *If you need a refill on your cardiac medications before your next appointment, please call your pharmacy*   Lab Work: Your provider would like for you to have the following labs today: BNP  If you have labs (blood work) drawn today and your tests are completely normal, you will receive your results only by: [place] (if you have MyChart) OR A paper copy in the mail If you have any lab test that is abnormal or we need to change your treatment, we will call you to review the results.   Testing/Procedures: Your physician has requested that you have an echocardiogram. Echocardiography is a painless test that uses sound waves to create images of your heart. It provides your doctor with information about the size and shape of your heart and how well your hearts chambers and valves are working. You may receive an ultrasound enhancing agent through an IV if needed to better visualize your heart during the echo.This procedure takes approximately one hour. There are no restrictions for this procedure. This will take place at the [address], [address].     Follow-Up: At [facility], you and your health needs are our priority.  As part of our continuing mission to provide you with exceptional heart care, we have created designated Provider Care Teams.  These Care Teams include your primary Cardiologist (physician) and Advanced Practice Providers (APPs -  Physician Assistants and Nurse Practitioners) who all work together to provide you with the care you need, when you need it.  We recommend signing up for the patient portal called "MyChart".  Sign up information is provided on this After Visit Summary.  MyChart is used to connect with patients for Virtual Visits (Telemedicine).  Patients are able to view lab/test results, encounter notes, upcoming appointments, etc.  Non-urgent messages can be sent to your provider as well.   To learn more about what  you can do with MyChart, go to [email or web address].    Your next appointment:   6 week(s)  The format for your next appointment:   In Person  Provider:   Minus Breeding, MD

## 2021-10-06 LAB — BRAIN NATRIURETIC PEPTIDE: BNP: 65.2 pg/mL (ref 0.0–100.0)

## 2021-10-08 ENCOUNTER — Encounter: Payer: Self-pay | Admitting: *Deleted

## 2021-10-12 ENCOUNTER — Other Ambulatory Visit: Payer: Self-pay

## 2021-10-12 ENCOUNTER — Encounter: Payer: Self-pay | Admitting: Pulmonary Disease

## 2021-10-12 ENCOUNTER — Ambulatory Visit: Payer: Medicare HMO | Admitting: Pulmonary Disease

## 2021-10-12 ENCOUNTER — Ambulatory Visit (INDEPENDENT_AMBULATORY_CARE_PROVIDER_SITE_OTHER): Payer: Medicare HMO | Admitting: Pulmonary Disease

## 2021-10-12 VITALS — BP 132/74 | HR 57 | Ht 66.0 in | Wt 161.8 lb

## 2021-10-12 DIAGNOSIS — J8410 Pulmonary fibrosis, unspecified: Secondary | ICD-10-CM

## 2021-10-12 DIAGNOSIS — R490 Dysphonia: Secondary | ICD-10-CM

## 2021-10-12 DIAGNOSIS — R0602 Shortness of breath: Secondary | ICD-10-CM

## 2021-10-12 LAB — PULMONARY FUNCTION TEST
DL/VA % pred: 69 %
DL/VA: 2.72 ml/min/mmHg/L
DLCO cor % pred: 54 %
DLCO cor: 11.35 ml/min/mmHg
DLCO unc % pred: 54 %
DLCO unc: 11.35 ml/min/mmHg
FEF 25-75 Post: 2.26 L/sec
FEF 25-75 Pre: 0.39 L/sec
FEF2575-%Change-Post: 486 %
FEF2575-%Pred-Post: 154 %
FEF2575-%Pred-Pre: 26 %
FEV1-%Change-Post: 78 %
FEV1-%Pred-Post: 88 %
FEV1-%Pred-Pre: 49 %
FEV1-Post: 1.97 L
FEV1-Pre: 1.11 L
FEV1FVC-%Change-Post: 54 %
FEV1FVC-%Pred-Pre: 66 %
FEV6-%Change-Post: 27 %
FEV6-%Pred-Post: 91 %
FEV6-%Pred-Pre: 71 %
FEV6-Post: 2.71 L
FEV6-Pre: 2.13 L
FEV6FVC-%Change-Post: 5 %
FEV6FVC-%Pred-Post: 108 %
FEV6FVC-%Pred-Pre: 103 %
FVC-%Change-Post: 15 %
FVC-%Pred-Post: 84 %
FVC-%Pred-Pre: 73 %
FVC-Post: 2.71 L
FVC-Pre: 2.35 L
Post FEV1/FVC ratio: 73 %
Post FEV6/FVC ratio: 100 %
Pre FEV1/FVC ratio: 47 %
Pre FEV6/FVC Ratio: 95 %
RV % pred: 80 %
RV: 1.98 L
TLC % pred: 71 %
TLC: 4.38 L

## 2021-10-12 NOTE — Addendum Note (Signed)
Addended by: Wonda Horner on: 10/12/2021 03:48 PM   Modules accepted: Orders

## 2021-10-12 NOTE — Patient Instructions (Addendum)
You have pulmonary fibrosis based on your high resolution CT chest scan from last month.  Your pulmonary function tests show moderate restriction and moderate diffusion defect. These issues are both related to the pulmonary fibrosis.   We will reach out to our pharmacy team to schedule a time to discuss anti-fibrotic therapy options.   For more information on pulmonary fibrosis, please visit: [email or web address]

## 2021-10-12 NOTE — Progress Notes (Signed)
PFT done today. 

## 2021-10-12 NOTE — Progress Notes (Signed)
Synopsis: Referred in December 2022 for abnormal chest x-ray by Holland Commons, FNP  Subjective:   PATIENT ID: Thomas Bond GENDER: male DOB: Sep 30, 1940, MRN: [id number]   HPI  Chief Complaint  Patient presents with   Follow-up    F/U after PFT   Thomas Bond is an 81 year old male, never smoker with GERD, hypertension, dysphagia and history of stroke who returns to pulmonary clinic for abnormal chest x-ray.   HRCT Chest 08/24/21 shows evidence of usual interstitial pneumonia with widespread areas of ground glass atenuation, septal thickening, subpleural reticulation, traction bronchiectasis and peripheral bronchiolectasis with very mild honeycombing in the lung bases.   PFTs today show mild restriction with moderate diffusion defect.   He did not notice benefit from [facility] ellipta inhaler.   He continues to have hoarseness of voice which is new over recent months. MBS from 2019 noted from office visit 08/17/21 with anterior cervical spine intrusion of pharynx/esophagus.  OV 08/17/21 Patient reports having shortness of breath over the past couple of years.  He has been tried on Asmanex as needed which he did not notice much improvement in his symptoms.  He has issues with dysphagia and has been evaluated by speech therapy multiple times over the past years.  Modified barium swallow from 2019 which shows mild pharyngeal cervical esophageal dysphagia due to mechanical obstruction from anterior cervical spine.  No aspiration observed and trace laryngeal penetration of thin liquids from vallecular residuals spilling into open airway cleared with further swallow.  Epiglottic deflection decreased likely due to cervical spine protrusion into pharynx which contributes to pharyngeal residuals.  He denies any issues with frequent pneumonias over the past years.  He denies any cough but does report wheezing.  He does report hoarseness in his voice which has been more constant over recent  months.  He denies any seasonal allergies.  He denies any sinus congestion or postnasal drainage.  He is a never smoker but is exposed to significant secondhand smoke growing up as his parents smoked in the household.  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  He complains that his feet feel swollen in the morning time but when he checks them they do not appear swollen.  Past Medical History:  Diagnosis Date   Aortic atherosclerosis (HCC)    GERD (gastroesophageal reflux disease)    Gout    Hypertension    Sleep apnea 2013   Not CPAP   TIA      Family History  Problem Relation Age of Onset   Alzheimer's disease Mother    Heart attack Brother 60   Stroke Neg Hx      Social History   Socioeconomic History   Marital status: Married    Spouse name: Not on file   Number of children: Not on file   Years of education: Not on file   Highest education level: Not on file  Occupational History   Not on file  Tobacco Use   Smoking status: Never   Smokeless tobacco: Never  Substance and Sexual Activity   Alcohol use: Yes    Comment: occasionally beer on weekends   Drug use: No   Sexual activity: Not on file  Other Topics Concern   Not on file  Social History Narrative   Lives with wife.  4 children over two marriages.  Retired Land Strain:  Not on file  Food Insecurity: Not on file  Transportation Needs: Not on file  Physical Activity: Not on file  Stress: Not on file  Social Connections: Not on file  Intimate Partner Violence: Not on file     No Known Allergies   Outpatient Medications Prior to Visit  Medication Sig Dispense Refill   allopurinol (ZYLOPRIM) 300 MG tablet Take 1 tablet by mouth daily.     amLODipine (NORVASC) 5 MG tablet Take 1 tablet by mouth daily.     atenolol (TENORMIN) 50 MG tablet Take 1 tablet by mouth daily.     cetirizine  (ZYRTEC) 10 MG tablet 1 tablet     clopidogrel (PLAVIX) 75 MG tablet Take 1 tablet by mouth daily.     famotidine (PEPCID) 20 MG tablet Take 20 mg by mouth daily.     fluticasone furoate-vilanterol (BREO ELLIPTA) 100-25 MCG/ACT AEPB Inhale 1 puff into the lungs daily. 28 each 3   hydrochlorothiazide (MICROZIDE) 12.5 MG capsule Take 1 capsule by mouth daily.     levothyroxine (SYNTHROID) 25 MCG tablet Take 1 tablet by mouth daily.     omeprazole (PRILOSEC) 20 MG capsule Take 1 capsule by mouth 2 (two) times daily.     sertraline (ZOLOFT) 50 MG tablet 1 tablet     No facility-administered medications prior to visit.    Review of Systems  Constitutional:  Negative for chills, fever, malaise/fatigue and weight loss.  HENT:  Negative for congestion, sinus pain and sore throat.        Difficulty swallowing  Eyes: Negative.   Respiratory:  Positive for cough and shortness of breath. Negative for hemoptysis, sputum production and wheezing.   Cardiovascular:  Negative for chest pain, palpitations, orthopnea, claudication and leg swelling.  Gastrointestinal:  Negative for abdominal pain, heartburn, nausea and vomiting.  Genitourinary: Negative.   Musculoskeletal:  Negative for joint pain and myalgias.  Skin:  Negative for rash.  Neurological:  Negative for weakness.  Endo/Heme/Allergies: Negative.   Psychiatric/Behavioral: Negative.       Objective:   Vitals:   10/12/21 1353  BP: 132/74  Pulse: (!) 57  SpO2: 97%  Weight: 161 lb 12.8 oz (73.4 kg)  Height: 5\' 6"  (1.676 m)     Physical Exam Constitutional:      General: He is not in acute distress. HENT:     Head: Normocephalic and atraumatic.  Eyes:     Conjunctiva/sclera: Conjunctivae normal.  Cardiovascular:     Rate and Rhythm: Normal rate and regular rhythm.     Pulses: Normal pulses.     Heart sounds: Normal heart sounds. No murmur heard. Pulmonary:     Breath sounds: Rales (Bibasilar) present.  Musculoskeletal:      Right lower leg: No edema.     Left lower leg: No edema.  Skin:    General: Skin is warm and dry.  Neurological:     General: No focal deficit present.     Mental Status: He is alert.  Psychiatric:        Mood and Affect: Mood normal.        Behavior: Behavior normal.        Thought Content: Thought content normal.        Judgment: Judgment normal.   CBC    Component Value Date/Time   WBC 10.0 05/22/2016 1829   RBC 4.61 05/22/2016 1829   HGB 16.3 05/22/2016 1838   HCT 48.0 05/22/2016 1838   PLT 255 05/22/2016  1829   MCV 94.8 05/22/2016 1829   MCH 32.3 05/22/2016 1829   MCHC 34.1 05/22/2016 1829   RDW 14.1 05/22/2016 1829   LYMPHSABS 3.8 05/22/2016 1829   MONOABS 0.9 05/22/2016 1829   EOSABS 0.6 05/22/2016 1829   BASOSABS 0.1 05/22/2016 1829   BMP Latest Ref Rng & Units 05/22/2016 05/22/2016  Glucose 65 - 99 mg/dL 94 95  BUN 6 - 20 mg/dL 14 12  Creatinine 0.61 - 1.24 mg/dL 1.20 1.28(H)  Sodium 135 - 145 mmol/L 140 137  Potassium 3.5 - 5.1 mmol/L 3.5 3.4(L)  Chloride 101 - 111 mmol/L 100(L) 100(L)  CO2 22 - 32 mmol/L - 25  Calcium 8.9 - 10.3 mg/dL - 9.5   Chest imaging: HRCT Chest 08/24/21 1. The appearance of the lungs is considered diagnostic of usual interstitial pneumonia (UIP) per current ATS guidelines. 2. Aortic atherosclerosis, in addition to left main and 3 vessel coronary artery disease. 3. There are calcifications of the aortic valve. Echocardiographic correlation for evaluation of potential valvular dysfunction may be warranted if clinically indicated. 4. Morphologic changes in the liver, suggestive of underlying cirrhosis.  PFT: PFT Results Latest Ref Rng & Units 10/12/2021  FVC-Pre L 2.35  FVC-Predicted Pre % 73  FVC-Post L 2.71  FVC-Predicted Post % 84  Pre FEV1/FVC % % 47  Post FEV1/FCV % % 73  FEV1-Pre L 1.11  FEV1-Predicted Pre % 49  FEV1-Post L 1.97  DLCO uncorrected ml/min/mmHg 11.35  DLCO UNC% % 54  DLCO corrected ml/min/mmHg 11.35  DLCO  COR %Predicted % 54  DLVA Predicted % 69  TLC L 4.38  TLC % Predicted % 71  RV % Predicted % 80  2023: Mild restriction, moderate diffusion defect  Labs:  Path:  Echo:  Heart Catheterization:  Assessment & Plan:   Pulmonary fibrosis (HCC)  Hoarseness of voice  Discussion: Thomas Bond is an 81 year old male, never smoker with GERD, hypertension, dysphagia and history of stroke who returns to pulmonary clinic for abnormal chest x-ray.   Patient's high resolution CT Chest is diagnostic of pulmonary fibrosis with usual interstitial pattern. We discussed that this can be a progressive disease and that we have pirfendione and nintendanib for antifibrotic treatments. He is aware of the GI side effect profile and photosensitivity side effects as well. He would like to discuss these medications further with our pharmacy team before starting. He is also aware that these medications decrease the rate of progression of fibrosis by 50%.  He is to stop breo ellipta since he did not note any improvement.   We will refer patient to ENT for further evaluation of his hoarseness of voice.  We will schedule follow up based on his decision with pharmacy team to initiate therapy.  Freda Jackson, MD [facility] Office: [phone]   Current Outpatient Medications:    allopurinol (ZYLOPRIM) 300 MG tablet, Take 1 tablet by mouth daily., Disp: , Rfl:    amLODipine (NORVASC) 5 MG tablet, Take 1 tablet by mouth daily., Disp: , Rfl:    atenolol (TENORMIN) 50 MG tablet, Take 1 tablet by mouth daily., Disp: , Rfl:    cetirizine (ZYRTEC) 10 MG tablet, 1 tablet, Disp: , Rfl:    clopidogrel (PLAVIX) 75 MG tablet, Take 1 tablet by mouth daily., Disp: , Rfl:    famotidine (PEPCID) 20 MG tablet, Take 20 mg by mouth daily., Disp: , Rfl:    fluticasone furoate-vilanterol (BREO ELLIPTA) 100-25 MCG/ACT AEPB, Inhale 1  puff into the lungs daily., Disp: 28 each, Rfl: 3    hydrochlorothiazide (MICROZIDE) 12.5 MG capsule, Take 1 capsule by mouth daily., Disp: , Rfl:    levothyroxine (SYNTHROID) 25 MCG tablet, Take 1 tablet by mouth daily., Disp: , Rfl:    omeprazole (PRILOSEC) 20 MG capsule, Take 1 capsule by mouth 2 (two) times daily., Disp: , Rfl:    sertraline (ZOLOFT) 50 MG tablet, 1 tablet, Disp: , Rfl:

## 2021-10-13 ENCOUNTER — Encounter: Payer: Self-pay | Admitting: Pulmonary Disease

## 2021-10-15 NOTE — Progress Notes (Signed)
Patient scheduled for pharmacy clinic visit on 10/17/21 at 3:20p  Knox Saliva, PharmD, MPH, BCPS Clinical Pharmacist (Rheumatology and Pulmonology)

## 2021-10-16 ENCOUNTER — Other Ambulatory Visit: Payer: Medicare HMO | Admitting: Pharmacist

## 2021-10-17 ENCOUNTER — Other Ambulatory Visit: Payer: Self-pay

## 2021-10-17 ENCOUNTER — Ambulatory Visit (HOSPITAL_COMMUNITY): Payer: Medicare HMO | Attending: Cardiology

## 2021-10-17 ENCOUNTER — Ambulatory Visit (INDEPENDENT_AMBULATORY_CARE_PROVIDER_SITE_OTHER): Payer: Medicare HMO | Admitting: Pharmacist

## 2021-10-17 DIAGNOSIS — R0602 Shortness of breath: Secondary | ICD-10-CM | POA: Insufficient documentation

## 2021-10-17 DIAGNOSIS — Z79899 Other long term (current) drug therapy: Secondary | ICD-10-CM

## 2021-10-17 LAB — ECHOCARDIOGRAM COMPLETE
AR max vel: 1.2 cm2
AV Area VTI: 1.38 cm2
AV Area mean vel: 1.16 cm2
AV Mean grad: 14.4 mmHg
AV Peak grad: 26.1 mmHg
Ao pk vel: 2.55 m/s
Area-P 1/2: 2.87 cm2
S' Lateral: 2.9 cm

## 2021-10-18 NOTE — Progress Notes (Signed)
Subjective:  Patient presents today to [facility] to see pharmacy team for Ofev and Esbriet (pirfenidone) counseling.   Patient was last seen and referred by Dr. Erin Fulling on 10/12/21. 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 Cardiac history significant for acute ischemic stroke, CAD, HTN, hyperlipidemia.  Patient expresses being overwhelmed with recent ILD diagnosis - especially since he has not had significant exposures.  He is interested to know if it's necessary to start medication and if delaying initiation by several months will be detrimental.   History of CAD: Yes History of MI: No Current anticoagulant use: No History of HTN: Yes  History of elevated LFTs: no recent labs but HRCT shows cirrhosis History of diarrhea, nausea, vomiting: No  Objective: No Known Allergies  Outpatient Encounter Medications as of 10/17/2021  Medication Sig   allopurinol (ZYLOPRIM) 300 MG tablet Take 1 tablet by mouth daily.   amLODipine (NORVASC) 5 MG tablet Take 1 tablet by mouth daily.   atenolol (TENORMIN) 50 MG tablet Take 1 tablet by mouth daily.   cetirizine (ZYRTEC) 10 MG tablet 1 tablet   clopidogrel (PLAVIX) 75 MG tablet Take 1 tablet by mouth daily.   famotidine (PEPCID) 20 MG tablet Take 20 mg by mouth daily.   fluticasone furoate-vilanterol (BREO ELLIPTA) 100-25 MCG/ACT AEPB Inhale 1 puff into the lungs daily.   hydrochlorothiazide (MICROZIDE) 12.5 MG capsule Take 1 capsule by mouth daily.   levothyroxine (SYNTHROID) 25 MCG tablet Take 1 tablet by mouth daily.   omeprazole (PRILOSEC) 20 MG capsule Take 1 capsule by mouth 2 (two) times daily.   sertraline (ZOLOFT) 50 MG tablet 1 tablet   No facility-administered encounter medications on file as of 10/17/2021.     Immunization History  Administered Date(s) Administered   Influenza, High Dose Seasonal PF 08/25/2013, 11/06/2015, 05/30/2016   Influenza, Quadrivalent, Recombinant, Inj, Pf 07/03/2017, 07/28/2018,  07/15/2019, 07/26/2020, 08/01/2021   PFIZER(Purple Top)SARS-COV-2 Vaccination 11/12/2019, 12/08/2019, 08/17/2020   Pneumococcal Conjugate-13 11/01/2014      PFT's TLC  Date Value Ref Range Status  10/12/2021 4.38 L Preliminary      CMP     Component Value Date/Time   NA 140 05/22/2016 1838   K 3.5 05/22/2016 1838   CL 100 (L) 05/22/2016 1838   CO2 25 05/22/2016 1829   GLUCOSE 94 05/22/2016 1838   BUN 14 05/22/2016 1838   CREATININE 1.20 05/22/2016 1838   CALCIUM 9.5 05/22/2016 1829   PROT 7.9 05/22/2016 1829   ALBUMIN 4.8 05/22/2016 1829   AST 45 (H) 05/22/2016 1829   ALT 36 05/22/2016 1829   ALKPHOS 54 05/22/2016 1829   BILITOT 0.8 05/22/2016 1829   GFRNONAA 52 (L) 05/22/2016 1829   GFRAA >60 05/22/2016 1829      CBC    Component Value Date/Time   WBC 10.0 05/22/2016 1829   RBC 4.61 05/22/2016 1829   HGB 16.3 05/22/2016 1838   HCT 48.0 05/22/2016 1838   PLT 255 05/22/2016 1829   MCV 94.8 05/22/2016 1829   MCH 32.3 05/22/2016 1829   MCHC 34.1 05/22/2016 1829   RDW 14.1 05/22/2016 1829   LYMPHSABS 3.8 05/22/2016 1829   MONOABS 0.9 05/22/2016 1829   EOSABS 0.6 05/22/2016 1829   BASOSABS 0.1 05/22/2016 1829      LFT's Hepatic Function Latest Ref Rng & Units 05/22/2016  Total Protein 6.5 - 8.1 g/dL 7.9  Albumin 3.5 - 5.0 g/dL 4.8  AST 15 - 41 U/L 45(H)  ALT 17 -  63 U/L 36  Alk Phosphatase 38 - 126 U/L 54  Total Bilirubin 0.3 - 1.2 mg/dL 0.8    HRCT (08/25/21) - appearance of the lungs is considered diagnostic of usual interstitial pneumonia (UIP) per current ATS guidelines.  Assessment and Plan Thoroughly counseled patient on the efficacy, mechanism of action, dosing, administration, adverse effects, and monitoring parameters of Esbriet and Ofev  Patient verbalized understanding. Patient education handouts provided for both medication. Patient assistance applications provided for both medications.  Reviewed that HRCT shows progressive type of fibrosis  though we can't predict how much progression may occur if he delays therapy. Reviewed that progression is generally expected but if he'd like to forgo starting antifibrotic for 3-4 months due to travel plans, we could still help ensure that he has access to medication once he is ready to start.  Esbriet Counseling:   Goals of Therapy: Will not stop or reverse the progression of ILD. It will slow the progression of ILD.   Dosing: Starting dose will be Esbriet 267 mg 1 tablet three times daily for 7 days, then 2 tablets three times daily for 7 days, then 3 tablets three times daily.  Maintenance dose will be 801 mg 1 tablet three times daily if tolerated.  Stressed the importance of taking with meals and space at least 5-6 hours apart to minimize stomach upset.   Adverse Effects: Nausea, vomiting, diarrhea, weight loss Abdominal pain GERD Sun sensitivity/rash - patient advised to wear sunscreen when exposed to sunlight Dizziness Fatigue  Ofev Medication Management  Goals of Therapy: Will not stop or reverse the progression of ILD. It will slow the progression of ILD.  Inhibits tyrosine kinase inhibitors which slow the fibrosis/progression of ILD -Significant reduction in the rate of disease progression was observed after treatment (61.1% [before] vs 33.3% [after], P?=?0.008) over 42 weeks.  Dosing: 150 mg (one capsule) by mouth twice daily (approx 12 hours apart). Discussed taking with food approximately 12 hours apart. Discussed that capsule should not be crushed or split.  Adverse Effects: Nausea, vomiting, diarrhea (2 in 3 patients) appetite loss, weight loss - management of diarrhea with loperamide discussed including max use of 48 hours and max of 8 capsules per day. Abdominal pain (up to 1 in 5 patients) Nasopharyngitis (13%), UTI (6%) Risk of thrombosis (3%) and acute MI (2%) Hypertension (5%) Dizziness Fatigue (10%)  Monitoring: Monitor for diarrhea, nausea and vomiting, GI  perforation, hepatotoxicity  Monitor LFTs - baseline, monthly for first 6 months, then every 3 months routinely CBC w differential at baseline and every 3 months routinely Needs updated CMP prior to starting - no recent LFTs or renal function  Access for both Ofev and Esbriet: Patient will likely have to go through patient assistnace for whichever antifibrotic he chooses if he wants to start one. Patient took home both medications' patient assistance applications today.  Medication Reconciliation A drug regimen assessment was performed, including review of allergies, interactions, disease-state management, dosing and immunization history. Medications were reviewed with the patient, including name, instructions, indication, goals of therapy, potential side effects, importance of adherence, and safe use.  Anticoagulant use: No  Patient advised to discontinue Breo since he did not benefit from [place]  No drug interactions with current med list and adding on either Esbriet/Ofev.  Immunizations Patient has received 3 COVID19 vaccines. Patient UTD on influenza vaccine. Patient eligible for zoster vaccine.  PLAN: - Patient would like to continue to weigh decision to start antifibrotic. Diagnosis is recent and  he feels slightly overwhelmed.  At this time, he states if he were to start, he would likely want to start Ofev due to ease of twice daily dosing. He is aware that not all patients clinically benefit from antifibrotics. He is aware that side effects can be limiting. However, since he does not have history of nausea or diarrhea, either medication could be considered for trial. Ue to patient's cardiac history, Ofev has cardiac risks that Esbriet does not which we reviewed in detail. We reviewed these risks are small but important for him to consider with his medcal history. He would still be willing to try Ofev first - We also reviewed that his estimated household income would qualify him for either  patient assistance program so cost will not be a barrier to him starting medication - I reviewed we could ensure medication access question is answered and he can start several months down the road if he'd like. He would like to think more about next steps.  This appointment required 50 minutes of patient care (this includes precharting, chart review, review of results, face-to-face care, etc.).  Thank you for involving pharmacy to assist in providing this patient's care.   Knox Saliva, PharmD, MPH, BCPS Clinical Pharmacist (Rheumatology and Pulmonology)

## 2021-10-22 ENCOUNTER — Other Ambulatory Visit: Payer: Self-pay | Admitting: Physician Assistant

## 2021-10-22 DIAGNOSIS — K219 Gastro-esophageal reflux disease without esophagitis: Secondary | ICD-10-CM | POA: Diagnosis not present

## 2021-10-22 DIAGNOSIS — R634 Abnormal weight loss: Secondary | ICD-10-CM

## 2021-10-22 DIAGNOSIS — Z86010 Personal history of colonic polyps: Secondary | ICD-10-CM | POA: Diagnosis not present

## 2021-10-22 DIAGNOSIS — R195 Other fecal abnormalities: Secondary | ICD-10-CM

## 2021-10-22 DIAGNOSIS — R1312 Dysphagia, oropharyngeal phase: Secondary | ICD-10-CM | POA: Diagnosis not present

## 2021-10-22 DIAGNOSIS — D509 Iron deficiency anemia, unspecified: Secondary | ICD-10-CM | POA: Diagnosis not present

## 2021-10-22 DIAGNOSIS — J8410 Pulmonary fibrosis, unspecified: Secondary | ICD-10-CM | POA: Diagnosis not present

## 2021-10-24 ENCOUNTER — Telehealth (HOSPITAL_COMMUNITY): Payer: Self-pay

## 2021-10-29 ENCOUNTER — Telehealth: Payer: Self-pay | Admitting: Pharmacist

## 2021-10-29 NOTE — Telephone Encounter (Signed)
Patient dropped off Ofev patient assistance paperwork along with income documents. Will place in PAP pending info folder in pharmacy office while we await Dr. August Albino portion and prior authorization determination  Submitted a Prior Authorization request to [facility] for [place] via CoverMyMeds. Will update once we receive a response.  Key: [id number]  Knox Saliva, PharmD, MPH, BCPS Clinical Pharmacist (Rheumatology and Pulmonology)

## 2021-10-31 NOTE — Telephone Encounter (Addendum)
Provider form for [facility] pt assistance application for Ofev received. Pending prior auth response from [facility], PharmD, MPH, BCPS Clinical Pharmacist (Rheumatology and Pulmonology)

## 2021-11-01 NOTE — Addendum Note (Signed)
Addended by: Cassandria Anger on: 11/01/2021 08:41 AM   Modules accepted: Level of Service

## 2021-11-02 ENCOUNTER — Ambulatory Visit
Admission: RE | Admit: 2021-11-02 | Discharge: 2021-11-02 | Disposition: A | Discharge status: Home / Self Care | Payer: Medicare HMO | Source: Ambulatory Visit | Attending: Physician Assistant | Admitting: Physician Assistant

## 2021-11-02 ENCOUNTER — Other Ambulatory Visit: Payer: Self-pay

## 2021-11-02 ENCOUNTER — Other Ambulatory Visit (HOSPITAL_COMMUNITY): Payer: Self-pay

## 2021-11-02 DIAGNOSIS — K7689 Other specified diseases of liver: Secondary | ICD-10-CM | POA: Diagnosis not present

## 2021-11-02 DIAGNOSIS — N281 Cyst of kidney, acquired: Secondary | ICD-10-CM | POA: Diagnosis not present

## 2021-11-02 DIAGNOSIS — R195 Other fecal abnormalities: Secondary | ICD-10-CM

## 2021-11-02 DIAGNOSIS — R634 Abnormal weight loss: Secondary | ICD-10-CM

## 2021-11-02 DIAGNOSIS — D509 Iron deficiency anemia, unspecified: Secondary | ICD-10-CM

## 2021-11-02 DIAGNOSIS — K5730 Diverticulosis of large intestine without perforation or abscess without bleeding: Secondary | ICD-10-CM | POA: Diagnosis not present

## 2021-11-02 MED ORDER — IOPAMIDOL (ISOVUE-300) INJECTION 61%
100.0000 mL | Freq: Once | INTRAVENOUS | Status: AC | PRN
  Administered 2021-11-02: 100 mL via INTRAVENOUS

## 2021-11-02 NOTE — Telephone Encounter (Signed)
Per test claim, prior authorization still required for Ofev. Called [employer] for update on authorization. Per automated system, additional clinical information is needed. Per rep, previous PA has to be voiced and prior authorization has to be restarted via phone. Turnaround time is 24-72 hours.  Case ID (for PA started on [facility]): [id number] Phone: [phone] Case ID (for PA started on phone): [id number]  Knox Saliva, PharmD, MPH, BCPS Clinical Pharmacist (Rheumatology and Pulmonology)

## 2021-11-12 ENCOUNTER — Other Ambulatory Visit (HOSPITAL_COMMUNITY): Payer: Self-pay

## 2021-11-12 NOTE — Telephone Encounter (Signed)
Called [employer] for status update on patient's Ofev authorization. Per automated system, prior authorization for OFEV has been APPROVED from 11/02/21 to 09/15/22.   Per test claim, copay for 30 days supply is $3095.78  Authorization # [id number] Phone # [phone]  Will submit patient assistance application once approval letter for insurance is received. Requested re-fax today  Knox Saliva, PharmD, MPH, BCPS Clinical Pharmacist (Rheumatology and Pulmonology)

## 2021-11-15 DIAGNOSIS — R0602 Shortness of breath: Secondary | ICD-10-CM | POA: Insufficient documentation

## 2021-11-15 DIAGNOSIS — I318 Other specified diseases of pericardium: Secondary | ICD-10-CM | POA: Insufficient documentation

## 2021-11-15 DIAGNOSIS — E118 Type 2 diabetes mellitus with unspecified complications: Secondary | ICD-10-CM | POA: Insufficient documentation

## 2021-11-15 NOTE — Progress Notes (Signed)
?  ?Cardiology Office Note ? ? ?Date:  11/16/2021  ? ?ID:  Thomas Bond, DOB 12/06/1940, MRN [id number] ? ?PCP:  Holland Commons, FNP  ?Cardiologist:   Minus Breeding, MD ?Referring:  Holland Commons, [place] ? ?Chief Complaint  ?Patient presents with  ? Chest Pain  ? ? ?  ?History of Present Illness: ?Thomas Bond is a 81 y.o. male who presents for evaluation of a mildly dilated thoracic aorta.  Coronary disease noted on the CT.  The patient was having some trouble breathing this summer which led to the CT.  CT suggested thickening of the pericardium with pericardial calcification.  There was aortic atherosclerosis.  There is also calcification in the great vessels of the mediastinum and the coronary arteries.  This appears to be all 3 vessels.  There was calcification of the aorta.The results of the have a calcium as noted.  ? ?After my first visit with him I sent him for an echo. There was significant aortic valve calcification but no significant stenosis and the LV function was normal.  In addition the BNP was normal.  Pericardium does not suggest any abnormality .  HRCT Chest 08/24/21 shows evidence of usual interstitial pneumonia with widespread areas of ground glass atenuation, septal thickening, subpleural reticulation, traction bronchiectasis and peripheral bronchiolectasis with very mild honeycombing in the lung bases.  He is followed by pulmonary. He is now diagnosed with pulmonary fibrosis.   He returns for follow up.  He is to be treated with Esbriet/OFEV.   ? ?He continues to be short of breath with activities.  He did not have any improvement with Breo and he has not yet started the above.  He is seeking approval to have this covered.  He denies any chest pressure, neck or arm discomfort.  He is short of breath with mild activity.  He also had a significant swallowing problem which has been ongoing.  He is going to see ENT apparently. ? ? ?Past Medical History:  ?Diagnosis Date  ? Aortic  atherosclerosis ([place])   ? GERD (gastroesophageal reflux disease)   ? Gout   ? Hypertension   ? Sleep apnea 2013  ? Not CPAP  ? TIA   ? ? ?Past Surgical History:  ?Procedure Laterality Date  ? COLONOSCOPY WITH PROPOFOL N/A 10/10/2014  ? Procedure: COLONOSCOPY WITH PROPOFOL;  Surgeon: Garlan Fair, MD;  Location: [facility];  Service: Endoscopy;  Laterality: N/A;  ? HERNIA REPAIR Bilateral 1984 and 1998  ? ? ? ?Current Outpatient Medications  ?Medication Sig Dispense Refill  ? allopurinol (ZYLOPRIM) 300 MG tablet Take 1 tablet by mouth daily.    ? amLODipine (NORVASC) 5 MG tablet Take 1 tablet by mouth daily.    ? atenolol (TENORMIN) 50 MG tablet Take 1 tablet by mouth daily.    ? atorvastatin (LIPITOR) 20 MG tablet Take 1 tablet (20 mg total) by mouth daily. 90 tablet 3  ? cetirizine (ZYRTEC) 10 MG tablet 1 tablet    ? clopidogrel (PLAVIX) 75 MG tablet Take 1 tablet by mouth daily.    ? famotidine (PEPCID) 20 MG tablet Take 20 mg by mouth daily.    ? hydrochlorothiazide (MICROZIDE) 12.5 MG capsule Take 1 capsule by mouth daily.    ? levothyroxine (SYNTHROID) 25 MCG tablet Take 1 tablet by mouth daily.    ? omeprazole (PRILOSEC) 20 MG capsule Take 1 capsule by mouth 2 (two) times daily.    ? sertraline (ZOLOFT) 50 MG  tablet 1 tablet    ? fluticasone furoate-vilanterol (BREO ELLIPTA) 100-25 MCG/ACT AEPB Inhale 1 puff into the lungs daily. (Patient not taking: Reported on 11/16/2021) 28 each 3  ? ?No current facility-administered medications for this visit.  ? ? ?Allergies:   Patient has no known allergies.  ? ?ROS:  Please see the history of present illness.   Otherwise, review of systems are positive for none.   All other systems are reviewed and negative.  ? ? ?PHYSICAL EXAM: ?VS:  BP 140/68   Pulse 60   Ht 5\' 6"  (1.676 m)   Wt 159 lb 6.4 oz (72.3 kg)   SpO2 94%   BMI 25.73 kg/m?  , BMI Body mass index is 25.73 kg/m?. ?GENERAL:  Well appearing ?NECK:  No jugular venous distention, waveform within normal  limits, carotid upstroke brisk and symmetric, no bruits, no thyromegaly ?LUNGS:  Clear to auscultation bilaterally ?CHEST: Decreased breath sounds throughout, no wheezing, mild diffuse crackles ?HEART:  PMI not displaced or sustained,S1 and S2 within normal limits, no S3, no S4, no clicks, no rubs, no murmurs ?ABD:  Flat, positive bowel sounds normal in frequency in pitch, no bruits, no rebound, no guarding, no midline pulsatile mass, no hepatomegaly, no splenomegaly ?EXT:  2 plus pulses throughout, no edema, no cyanosis no clubbing ? ? ?EKG:  EKG is not ordered today. ? ? ? ?Recent Labs: ?10/05/2021: BNP 65.2  ? ? ?Lipid Panel ?   ?Component Value Date/Time  ? CHOL 197 05/23/2016 0300  ? TRIG 236 (H) 05/23/2016 0300  ? HDL 30 (L) 05/23/2016 0300  ? CHOLHDL 6.6 05/23/2016 0300  ? VLDL 47 (H) 05/23/2016 0300  ? LDLCALC 120 (H) 05/23/2016 0300  ? ?  ? ?Wt Readings from Last 3 Encounters:  ?11/16/21 159 lb 6.4 oz (72.3 kg)  ?10/12/21 161 lb 12.8 oz (73.4 kg)  ?10/05/21 163 lb (73.9 kg)  ?  ? ? ?Other studies Reviewed: ?Additional studies/ records that were reviewed today include: CT and pulmonary records ?Review of the above records demonstrates:  Please see elsewhere in the note.   ? ? ?ASSESSMENT AND PLAN: ? ?CAD: Given his coronary calcium and his shortness of breath I need to make sure that he does not have any obstructive disease.  I am going to avoid contrast with some renal insufficiency that he had previously.  Therefore, he will need a Lexiscan Myoview.  Further evaluation will be based on this. ? ?AS: This has been mild.  I will follow this up clinically.  He did have an echo earlier this year. ? ?Aortic atherosclerosis:   He will be managed with aggressive risk reduction.  His LDL is not at target.  I will go ahead and treat him with Lipitor 20 mg and repeat a lipid profile in about 10 weeks.  ? ?CKD: Creatinine is somewhat improved at 1.31.  No change in therapy.  ? ?Pericardial calcification:   The patient  will have an echocardiogram and a BNP level to evaluate his shortness of breath.   ? ?Current medicines are reviewed at length with the patient today.  The patient does not have concerns regarding medicines. ? ?The following changes have been made: As above ? ?Labs/ tests ordered today include:  ? ?Orders Placed This Encounter  ?Procedures  ? Lipid Profile  ? MYOCARDIAL PERFUSION IMAGING  ? ? ? ?Disposition:   FU with me in 12 months..    ? ? ?Signed, ?Minus Breeding, MD  ?11/16/2021  4:11 PM    ?[place] ? ? ? ?

## 2021-11-15 NOTE — Telephone Encounter (Signed)
Submitted Patient Assistance Application to [employer] for OFEV along with provider portion, patient portion, med list, insurance card copy, PA and income documents. Will update patient when we receive a response. ? ?Fax# [phone] ?Phone# [phone] ? ?Knox Saliva, PharmD, MPH, BCPS ?Clinical Pharmacist (Rheumatology and Pulmonology) ? ?

## 2021-11-16 ENCOUNTER — Ambulatory Visit: Payer: Medicare HMO | Admitting: Cardiology

## 2021-11-16 ENCOUNTER — Encounter: Payer: Self-pay | Admitting: Cardiology

## 2021-11-16 ENCOUNTER — Other Ambulatory Visit: Payer: Self-pay

## 2021-11-16 VITALS — BP 140/68 | HR 60 | Ht 66.0 in | Wt 159.4 lb

## 2021-11-16 DIAGNOSIS — R0602 Shortness of breath: Secondary | ICD-10-CM | POA: Diagnosis not present

## 2021-11-16 DIAGNOSIS — Z79899 Other long term (current) drug therapy: Secondary | ICD-10-CM

## 2021-11-16 DIAGNOSIS — E1122 Type 2 diabetes mellitus with diabetic chronic kidney disease: Secondary | ICD-10-CM | POA: Diagnosis not present

## 2021-11-16 DIAGNOSIS — E118 Type 2 diabetes mellitus with unspecified complications: Secondary | ICD-10-CM

## 2021-11-16 DIAGNOSIS — I318 Other specified diseases of pericardium: Secondary | ICD-10-CM | POA: Diagnosis not present

## 2021-11-16 DIAGNOSIS — I2510 Atherosclerotic heart disease of native coronary artery without angina pectoris: Secondary | ICD-10-CM

## 2021-11-16 MED ORDER — ATORVASTATIN CALCIUM 20 MG PO TABS: 20.0000 mg | ORAL_TABLET | Freq: Every day | ORAL | 3 refills | Status: DC

## 2021-11-16 NOTE — Telephone Encounter (Signed)
Received fax from [facility] stating they are unable to continue processing the request due to the prescription received being invalid, and to send in a new Rx to [place] or call [phone] to provide one verbally. ? ?Upon closer inspection of prescription form of application, provider signature date is written as 10/30/20. ?

## 2021-11-16 NOTE — Patient Instructions (Addendum)
Medication Instructions:  ?Your physician has recommended you make the following change in your medication:  ?START: Lipitor 20 mg once daily ?*If you need a refill on your cardiac medications before your next appointment, please call your pharmacy* ? ? ?Lab Work: ?Your physician recommends that you return for lab work in:  ?10 weeks after starting medication:  Lipids - please come fasting ?If you have labs (blood work) drawn today and your tests are completely normal, you will receive your results only by: ?MyChart Message (if you have MyChart) OR ?A paper copy in the mail ?If you have any lab test that is abnormal or we need to change your treatment, we will call you to review the results. ? ? ?Testing/Procedures: ?Your physician has requested that you have a lexiscan myoview. For further information please visit [email or web address]. Please follow instruction sheet, as given. ? ? ?The test will take approximately 3 to 4 hours to complete; you may bring reading material.  If someone comes with you to your appointment, they will need to remain in the main lobby due to limited space in the testing area. **If you are pregnant or breastfeeding, please notify the nuclear lab prior to your appointment** ? ?How to prepare for your Myocardial Perfusion Test: ?Do not eat or drink 3 hours prior to your test, except you may have water. ?Do not consume products containing caffeine (regular or decaffeinated) 12 hours prior to your test. (ex: coffee, chocolate, sodas, tea). ?Do bring a list of your current medications with you.  If not listed below, you may take your medications as normal. ?Do wear comfortable clothes (no dresses or overalls) and walking shoes, tennis shoes preferred (No heels or open toe shoes are allowed). ?Do NOT wear cologne, perfume, aftershave, or lotions (deodorant is allowed). ?If these instructions are not followed, your test will have to be rescheduled. ? ? ? ?Follow-Up: ?At [facility], you and  your health needs are our priority.  As part of our continuing mission to provide you with exceptional heart care, we have created designated Provider Care Teams.  These Care Teams include your primary Cardiologist (physician) and Advanced Practice Providers (APPs -  Physician Assistants and Nurse Practitioners) who all work together to provide you with the care you need, when you need it. ? ?We recommend signing up for the patient portal called "MyChart".  Sign up information is provided on this After Visit Summary.  MyChart is used to connect with patients for Virtual Visits (Telemedicine).  Patients are able to view lab/test results, encounter notes, upcoming appointments, etc.  Non-urgent messages can be sent to your provider as well.   ?To learn more about what you can do with MyChart, go to [email or web address].   ? ?Your next appointment:   ?1 year(s) ? ?The format for your next appointment:   ?In Person ? ?Provider:   ?Minus Breeding, MD   ?  ?

## 2021-11-22 ENCOUNTER — Telehealth (HOSPITAL_COMMUNITY): Payer: Self-pay | Admitting: *Deleted

## 2021-11-22 NOTE — Telephone Encounter (Signed)
Close encounter 

## 2021-11-23 ENCOUNTER — Other Ambulatory Visit: Payer: Self-pay

## 2021-11-23 ENCOUNTER — Ambulatory Visit (HOSPITAL_COMMUNITY)
Admission: RE | Admit: 2021-11-23 | Discharge: 2021-11-23 | Disposition: A | Discharge status: Home / Self Care | Payer: Medicare HMO | Source: Ambulatory Visit | Attending: Cardiovascular Disease | Admitting: Cardiovascular Disease

## 2021-11-23 DIAGNOSIS — R0602 Shortness of breath: Secondary | ICD-10-CM | POA: Diagnosis not present

## 2021-11-23 LAB — MYOCARDIAL PERFUSION IMAGING
LV dias vol: 92 mL (ref 62–150)
LV sys vol: 46 mL
Nuc Stress EF: 50 %
Peak HR: 68 {beats}/min
Rest HR: 47 {beats}/min
Rest Nuclear Isotope Dose: 10.6 mCi
SSS: 1.1
ST Depression (mm): 0 mm
Stress Nuclear Isotope Dose: 31.8 mCi
TID: 1.1

## 2021-11-23 MED ORDER — TECHNETIUM TC 99M TETROFOSMIN IV KIT
31.8000 | PACK | Freq: Once | INTRAVENOUS | Status: AC | PRN
  Administered 2021-11-23: 31.8 via INTRAVENOUS
  Filled 2021-11-23: qty 32

## 2021-11-23 MED ORDER — TECHNETIUM TC 99M TETROFOSMIN IV KIT
10.6000 | PACK | Freq: Once | INTRAVENOUS | Status: AC | PRN
  Administered 2021-11-23: 10.6 via INTRAVENOUS
  Filled 2021-11-23: qty 11

## 2021-11-23 MED ORDER — OFEV 150 MG PO CAPS: 150.0000 mg | ORAL_CAPSULE | Freq: Two times a day (BID) | ORAL | 1 refills | Status: DC

## 2021-11-23 MED ORDER — REGADENOSON 0.4 MG/5ML IV SOLN
0.4000 mg | Freq: Once | INTRAVENOUS | Status: AC
  Administered 2021-11-23: 0.4 mg via INTRAVENOUS

## 2021-11-23 NOTE — Addendum Note (Signed)
Addended by: Cassandria Anger on: 11/23/2021 09:12 AM ? ? Modules accepted: Orders ? ?

## 2021-11-23 NOTE — Telephone Encounter (Signed)
Prescription for Ofev '150mg'$  twice daily sent to [place] ? ?Knox Saliva, PharmD, MPH, BCPS ?Clinical Pharmacist (Rheumatology and Pulmonology) ?

## 2021-11-28 NOTE — Telephone Encounter (Addendum)
Received a fax from  [facility] regarding an approval for [place] patient assistance from 11/26/2021 to 09/15/2022. Approval letter sent to scan center. ? ?Phone number: [phone] ?

## 2021-12-18 NOTE — Telephone Encounter (Signed)
ATC patient to determine status of Ofev shipment. Unable to reach - left VM requesting return call ? ?Knox Saliva, PharmD, MPH, BCPS ?Clinical Pharmacist (Rheumatology and Pulmonology) ?

## 2021-12-19 NOTE — Telephone Encounter (Signed)
Patient reached out but I was not in office. ATC patient but unable to reach. Left VM requesting return call ? ?Knox Saliva, PharmD, MPH, BCPS ?Clinical Pharmacist (Rheumatology and Pulmonology) ?

## 2021-12-20 NOTE — Telephone Encounter (Signed)
Received call from patient in morning that I missed. He stated in VM that he did not receive his medication. ATC patient - unable to reach. Left VM for patient with [employer] phone number for him to call to schedule Ofev shipment ? ?Will f/u ? ?Knox Saliva, PharmD, MPH, BCPS ?Clinical Pharmacist (Rheumatology and Pulmonology) ?

## 2021-12-21 ENCOUNTER — Ambulatory Visit: Payer: Medicare HMO | Admitting: Pulmonary Disease

## 2021-12-21 ENCOUNTER — Encounter: Payer: Self-pay | Admitting: Pulmonary Disease

## 2021-12-21 VITALS — BP 126/70 | HR 58 | Ht 66.0 in | Wt 155.4 lb

## 2021-12-21 DIAGNOSIS — J8410 Pulmonary fibrosis, unspecified: Secondary | ICD-10-CM

## 2021-12-21 DIAGNOSIS — E119 Type 2 diabetes mellitus without complications: Secondary | ICD-10-CM | POA: Diagnosis not present

## 2021-12-21 DIAGNOSIS — R1310 Dysphagia, unspecified: Secondary | ICD-10-CM

## 2021-12-21 NOTE — Patient Instructions (Addendum)
We will refer you to speech pathology for further evaluation of your trouble swallowing. ? ?We will try to have your ENT consult moved earlier this month.  ? ?Follow up in 6 weeks to monitor how the new medication is going. ? ? ?

## 2021-12-21 NOTE — Progress Notes (Addendum)
? ?Synopsis: Referred in December 2022 for abnormal chest x-ray by Holland Commons, FNP ? ?Subjective:  ? ?PATIENT ID: Thomas Bond GENDER: male DOB: 1940/12/26, MRN: [id number] ? ?HPI ? ?Chief Complaint  ?Patient presents with  ? Follow-up  ?  3 mo f/u. Has not started Ofev.   ? ?Thomas Bond is an 81 year old male, never smoker with GERD, hypertension, dysphagia and history of stroke who returns to pulmonary clinic for pulmonary fibrosis.  ? ?He met with our pharmacy team on 10/17/21 and has not received Esbriet yet. ? ?He had modified barium swallow as well as esophagram performed in January 2023 where he was noted to have the barium tablet lodged in the left piriform sinus.  He continues to have hoarse voice and trouble swallowing.  He is scheduled to see ENT in May. ? ?Pulmonary function test 10/12/2021 showed mixed obstructive and restrictive defects along with significant bronchodilator response and moderate diffusion defect. ? ?I reviewed his HRCT chest scan findings and pulmonary function test findings with the patient and his wife.  We had detailed discussion about the progression of pulmonary fibrosis and thinking about their advanced directives. ? ?OV 10/12/21 ?HRCT Chest 08/24/21 shows evidence of usual interstitial pneumonia with widespread areas of ground glass atenuation, septal thickening, subpleural reticulation, traction bronchiectasis and peripheral bronchiolectasis with very mild honeycombing in the lung bases.  ? ?PFTs today show mild restriction with moderate diffusion defect.  ? ?He did not notice benefit from [facility] ellipta inhaler.  ? ?He continues to have hoarseness of voice which is new over recent months. MBS from 2019 noted from office visit 08/17/21 with anterior cervical spine intrusion of pharynx/esophagus. ? ?OV 08/17/21 ?Patient reports having shortness of breath over the past couple of years.  He has been tried on Asmanex as needed which he did not notice much improvement in his  symptoms.  He has issues with dysphagia and has been evaluated by speech therapy multiple times over the past years.  Modified barium swallow from 2019 which shows mild pharyngeal cervical esophageal dysphagia due to mechanical obstruction from anterior cervical spine.  No aspiration observed and trace laryngeal penetration of thin liquids from vallecular residuals spilling into open airway cleared with further swallow.  Epiglottic deflection decreased likely due to cervical spine protrusion into pharynx which contributes to pharyngeal residuals.  He denies any issues with frequent pneumonias over the past years. ? ?He denies any cough but does report wheezing.  He does report hoarseness in his voice which has been more constant over recent months.  He denies any seasonal allergies.  He denies any sinus congestion or postnasal drainage. ? ?He is a never smoker but is exposed to significant secondhand smoke growing up as his parents smoked in the household.  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 ? ?He complains that his feet feel swollen in the morning time but when he checks them they do not appear swollen. ? ?Past Medical History:  ?Diagnosis Date  ? Aortic atherosclerosis ([place])   ? GERD (gastroesophageal reflux disease)   ? Gout   ? Hypertension   ? Sleep apnea 2013  ? Not CPAP  ? TIA   ?  ? ?Family History  ?Problem Relation Age of Onset  ? Alzheimer's disease Mother   ? Heart attack Brother 65  ? Stroke Neg Hx   ?  ? ?Social History  ? ?Socioeconomic History  ?  Marital status: Married  ?  Spouse name: Not on file  ? Number of children: Not on file  ? Years of education: Not on file  ? Highest education level: Not on file  ?Occupational History  ? Not on file  ?Tobacco Use  ? Smoking status: Never  ? Smokeless tobacco: Never  ?Substance and Sexual Activity  ? Alcohol use: Yes  ?  Comment: occasionally beer on weekends  ? Drug use: No  ? Sexual  activity: Not on file  ?Other Topics Concern  ? Not on file  ?Social History Narrative  ? Lives with wife.  4 children over two marriages.  Retired Optometrist  ? ?Social Determinants of Health  ? ?Financial Resource Strain: Not on file  ?Food Insecurity: Not on file  ?Transportation Needs: Not on file  ?Physical Activity: Not on file  ?Stress: Not on file  ?Social Connections: Not on file  ?Intimate Partner Violence: Not on file  ?  ? ?No Known Allergies  ? ?Outpatient Medications Prior to Visit  ?Medication Sig Dispense Refill  ? allopurinol (ZYLOPRIM) 300 MG tablet Take 1 tablet by mouth daily.    ? amLODipine (NORVASC) 5 MG tablet Take 1 tablet by mouth daily.    ? atenolol (TENORMIN) 50 MG tablet Take 1 tablet by mouth daily.    ? atorvastatin (LIPITOR) 20 MG tablet Take 1 tablet (20 mg total) by mouth daily. 90 tablet 3  ? cetirizine (ZYRTEC) 10 MG tablet 1 tablet    ? clopidogrel (PLAVIX) 75 MG tablet Take 1 tablet by mouth daily.    ? famotidine (PEPCID) 20 MG tablet Take 20 mg by mouth daily.    ? fluticasone furoate-vilanterol (BREO ELLIPTA) 100-25 MCG/ACT AEPB Inhale 1 puff into the lungs daily. 28 each 3  ? hydrochlorothiazide (MICROZIDE) 12.5 MG capsule Take 1 capsule by mouth daily.    ? levothyroxine (SYNTHROID) 25 MCG tablet Take 1 tablet by mouth daily.    ? Nintedanib (OFEV) 150 MG CAPS Take 1 capsule (150 mg total) by mouth 2 (two) times daily. 180 capsule 1  ? omeprazole (PRILOSEC) 20 MG capsule Take 1 capsule by mouth 2 (two) times daily.    ? sertraline (ZOLOFT) 50 MG tablet 1 tablet    ? ?No facility-administered medications prior to visit.  ? ? ?Review of Systems  ?Constitutional:  Negative for chills, fever, malaise/fatigue and weight loss.  ?HENT:  Negative for congestion, sinus pain and sore throat.   ?     Difficulty swallowing  ?Eyes: Negative.   ?Respiratory:  Positive for cough and shortness of breath. Negative for hemoptysis, sputum production and wheezing.   ?Cardiovascular:  Negative  for chest pain, palpitations, orthopnea, claudication and leg swelling.  ?Gastrointestinal:  Negative for abdominal pain, heartburn, nausea and vomiting.  ?Genitourinary: Negative.   ?Musculoskeletal:  Negative for joint pain and myalgias.  ?Skin:  Negative for rash.  ?Neurological:  Negative for weakness.  ?Endo/Heme/Allergies: Negative.   ?Psychiatric/Behavioral: Negative.    ? ? ? ?Objective:  ? ?Vitals:  ? 12/21/21 1500  ?BP: 126/70  ?Pulse: (!) 58  ?SpO2: 98%  ?Weight: 155 lb 6.4 oz (70.5 kg)  ?Height: 5' 6"  (6.213 m)  ? ? ? ? ?Physical Exam ?Constitutional:   ?   General: He is not in acute distress. ?HENT:  ?   Head: Normocephalic and atraumatic.  ?Eyes:  ?   Conjunctiva/sclera: Conjunctivae normal.  ?Cardiovascular:  ?   Rate and Rhythm: Normal rate and  regular rhythm.  ?   Pulses: Normal pulses.  ?   Heart sounds: Normal heart sounds. No murmur heard. ?Pulmonary:  ?   Breath sounds: Rales (Bibasilar) present.  ?Musculoskeletal:  ?   Right lower leg: No edema.  ?   Left lower leg: No edema.  ?Skin: ?   General: Skin is warm and dry.  ?Neurological:  ?   General: No focal deficit present.  ?   Mental Status: He is alert.  ?Psychiatric:     ?   Mood and Affect: Mood normal.     ?   Behavior: Behavior normal.     ?   Thought Content: Thought content normal.     ?   Judgment: Judgment normal.  ? ?CBC ?   ?Component Value Date/Time  ? WBC 10.0 05/22/2016 1829  ? RBC 4.61 05/22/2016 1829  ? HGB 16.3 05/22/2016 1838  ? HCT 48.0 05/22/2016 1838  ? PLT 255 05/22/2016 1829  ? MCV 94.8 05/22/2016 1829  ? MCH 32.3 05/22/2016 1829  ? MCHC 34.1 05/22/2016 1829  ? RDW 14.1 05/22/2016 1829  ? LYMPHSABS 3.8 05/22/2016 1829  ? MONOABS 0.9 05/22/2016 1829  ? EOSABS 0.6 05/22/2016 1829  ? BASOSABS 0.1 05/22/2016 1829  ? ? ?  Latest Ref Rng & Units 05/22/2016  ?  6:38 PM 05/22/2016  ?  6:29 PM  ?BMP  ?Glucose 65 - 99 mg/dL 94   95    ?BUN 6 - 20 mg/dL 14   12    ?Creatinine 0.61 - 1.24 mg/dL 1.20   1.28    ?Sodium 135 - 145 mmol/L  140   137    ?Potassium 3.5 - 5.1 mmol/L 3.5   3.4    ?Chloride 101 - 111 mmol/L 100   100    ?CO2 22 - 32 mmol/L  25    ?Calcium 8.9 - 10.3 mg/dL  9.5    ? ?Chest imaging: ?HRCT Chest 08/24/21 ?1. The ap

## 2021-12-22 ENCOUNTER — Encounter: Payer: Self-pay | Admitting: Pulmonary Disease

## 2021-12-25 ENCOUNTER — Telehealth (HOSPITAL_COMMUNITY): Payer: Self-pay | Admitting: *Deleted

## 2021-12-25 NOTE — Telephone Encounter (Signed)
Attempted to contact patient to schedule OP MBS. Left VM. RKEEL 

## 2021-12-27 ENCOUNTER — Telehealth (HOSPITAL_COMMUNITY): Payer: Self-pay | Admitting: *Deleted

## 2021-12-27 NOTE — Telephone Encounter (Signed)
Attempted to contact patient a second time about scheduling OP MBS before his OP Neuro ST eval on 01/01/22. Left VM. RKEEL ?

## 2021-12-28 ENCOUNTER — Telehealth (HOSPITAL_COMMUNITY): Payer: Self-pay | Admitting: *Deleted

## 2021-12-28 ENCOUNTER — Ambulatory Visit: Payer: Medicare HMO | Admitting: Speech Pathology

## 2021-12-28 NOTE — Telephone Encounter (Signed)
LATE ENTRY for 12/27/21. ?Patient returned my phone call but declined to schedule the OP MBS at this time. Patient stated that he has had several swallow test done and would like to wait until after his appointment with OP Neuro ST on 4.18.23. I will follow up with him after his appointment to see if he wants to schedule then. RKEEL ?

## 2021-12-31 ENCOUNTER — Ambulatory Visit: Payer: Medicare HMO | Admitting: Pulmonary Disease

## 2022-01-01 ENCOUNTER — Ambulatory Visit: Payer: Medicare HMO | Attending: Internal Medicine | Admitting: Speech Pathology

## 2022-01-01 DIAGNOSIS — R1310 Dysphagia, unspecified: Secondary | ICD-10-CM | POA: Insufficient documentation

## 2022-01-01 NOTE — Therapy (Signed)
?OUTPATIENT SPEECH LANGUAGE PATHOLOGY SWALLOW EVALUATION ? ? ?Patient Name: Thomas Bond ?MRN: [id number] ?DOB:1940-09-20, 81 y.o., male ?Today's Date: 01/02/2022 ? ?PCP: Holland Commons, FNP ?REFERRING PROVIDER: Freddi Starr, MD  ? ? End of Session - 01/02/22 0820   ? ? Visit Number 1   ? Number of Visits 9   ? Date for SLP Re-Evaluation 02/27/22   ? Authorization Type [employer]   ? Progress Note Due on Visit 10   ? SLP Start Time 8841   ? SLP Stop Time  1520   ? SLP Time Calculation (min) 38 min   ? Activity Tolerance Patient tolerated treatment well   ? ?  ?  ? ?  ? ? ?Past Medical History:  ?Diagnosis Date  ? Aortic atherosclerosis ([place])   ? GERD (gastroesophageal reflux disease)   ? Gout   ? Hypertension   ? Sleep apnea 2013  ? Not CPAP  ? TIA   ? ?Past Surgical History:  ?Procedure Laterality Date  ? COLONOSCOPY WITH PROPOFOL N/A 10/10/2014  ? Procedure: COLONOSCOPY WITH PROPOFOL;  Surgeon: Garlan Fair, MD;  Location: [facility];  Service: Endoscopy;  Laterality: N/A;  ? HERNIA REPAIR Bilateral 1984 and 1998  ? ?Patient Active Problem List  ? Diagnosis Date Noted  ? Type 2 diabetes mellitus with complication, without long-term current use of insulin ([place]) 11/15/2021  ? Pericardial calcification 11/15/2021  ? SOB (shortness of breath) 11/15/2021  ? Coronary artery disease involving native coronary artery of native heart without angina pectoris 10/04/2021  ? Aortic atherosclerosis ([place]) 10/04/2021  ? HTN (hypertension) 02/06/2017  ? Hyperlipemia 02/06/2017  ? Acute lacunar infarction ([place]) 08/06/2016  ? Acute ischemic stroke ([place]) 05/22/2016  ? ? ?ONSET DATE: referral 12/22/2021; swallow difficulties reported began 2+ years ago  ? ?REFERRING DIAG: R13.10 (ICD-10-CM) - Dysphagia, unspecified type  ? ?THERAPY DIAG:  ?Dysphagia, unspecified type ? ?SUBJECTIVE:  ? ?SUBJECTIVE STATEMENT: ?"Are you going to fix me?"  ?Pt accompanied by: self ? ?PERTINENT HISTORY: [name] is an 81 year  old male, never smoker with GERD, hypertension, dysphagia and history of stroke. He had modified barium swallow as well as esophagram performed in January 2023 where he was noted to have the barium tablet lodged in the left piriform sinus. He continues to have hoarse voice and trouble swallowing. He is scheduled to see ENT in May.  ? ?PAIN:  ?Are you having pain? No ? ?FALLS: Has patient fallen in last 6 months?  No, Comment: pt seems unsure, tells ST "I don't think so" following 60 seconds of thinking ? ?LIVING ENVIRONMENT: ?Lives with: lives with their spouse ?Lives in: House/apartment ? ?PLOF:  ?Level of assistance: Independent with ADLs, Independent with IADLs ?Employment: Retired ? ? ?PATIENT GOALS "fix me up"  ? ?OBJECTIVE:  ? ?DIAGNOSTIC FINDINGS: ESOPHAGUS/BARIUM SWALLOW/TABLET STUDY 10/02/21 FINDINGS: ?Swallowing: Appears normal. No vestibular penetration or aspiration ?seen. ?  ?Pharynx: Barium tablet was delayed in passage in the left pyriform ?sinus and was not successfully dislodged using manipulative ?techniques. ? ?RECOMMENDATIONS FROM OBJECTIVE SWALLOW STUDY (MBSS/FEES):  Pt to schedule following this visit.  ?Objective swallow impairments: TBD ?Objective recommended compensations: TBD ? ?COGNITION: ?Overall cognitive status: Within functional limits for tasks assessed ? ?CLINICAL SWALLOW ASSESSMENT:   ?Current diet: regular ?Dentition: adequate natural dentition ?Patient directly observed with POs: Yes: thin liquids  ?Feeding: able to feed self ?Liquids provided by: cup, sequential sips ?Oral phase signs and symptoms:  none ?Pharyngeal phase signs  and symptoms: suspected delayed swallow initiation, audible swallow, wet vocal quality, and delayed throat clear ? ?TODAY'S TREATMENT:  ?ST provided pt with education on normal swallow vs typically aging swallow. Education on general aspiration precautions, emphasis on modifying pill administration to increase safety.  ? ? ?PATIENT EDUCATION: ?Education  details: see above ?Person educated: Patient ?Education method: Explanation and Demonstration ?Education comprehension: verbalized understanding, returned demonstration, and needs further education ? ? ?ASSESSMENT: ? ?CLINICAL IMPRESSION: ?Patient is a 81 y.o. M who was seen today for dysphagia. Pt with demonstrated difficulty swallowing pills on 2019 MBSS and Jan 2023 barium swallow. Reports to taking multiple pills at a time with frequent occurences of globus sensation. Occasionally will cough up a pill he did not realize was still in pharynx. Also reportedly occurs about 1x/meal with solids. Pt will cough following or during meal and piece of food will come out. Reports coughing with liquids. Following thin liquid from cup, sequential sips, pt with s/sx of aspiration (delayed throat clear, audible swallow, change in vocal quality). Note that pt with shortness of breath at baseline. Impacting speech production. Vocal quality also observed to be hoarse, periods of aphonia reported at home. Reports voice change has been since last few months, though tells ST friend says it's been longer. Recommend MBSS to objectively assess swallow and ID potential targets for dysphagia exercises. I recommend short course of therapy to establish pharyngeal strengthening HEP to address demonstrated swallowing impairment.  ? ?OBJECTIVE IMPAIRMENTS include voice disorder and dysphagia. These impairments are limiting patient from safety when swallowing. ?Factors affecting potential to achieve goals and functional outcome are co-morbidities and previous level of function. Patient will benefit from skilled SLP services to address above impairments and improve overall function. ? ?REHAB POTENTIAL: Good ? ? ?GOALS: ?Goals reviewed with patient? Yes ? ?SHORT TERM GOALS = LONG TERM GOALS D/T LENGTH OF POC ? ?LONG TERM GOALS: Target date: 01/30/2022 ? ?Pt will verbalize and/or demonstrate recommended compensatory swallow strategies to reduce  risk of aspiration given rare min A over 2 sessions  ?Baseline:  ?Goal status: INITIAL ? ?2.  Pt will complete recommended swallow exercises to target pharyngeal deficits given usual mod A over 2 sessions  ?Baseline:  ?Goal status: INITIAL ? ?3.  Pt will complete swallow exercise HEP given occasional min A from family > 1 week  ?Baseline:  ?Goal status: INITIAL ? ?4.  Pt will complete MBSS to objectively assess swallow function/safety/musculature deficits ?Baseline:  ?Goal status: INITIAL ?PLAN: ?SLP FREQUENCY: 1x/week ? ?SLP DURATION: 6 weeks ? ?PLANNED INTERVENTIONS: Aspiration precaution training, Pharyngeal strengthening exercises, Diet toleration management , SLP instruction and feedback, and Compensatory strategies ? ? ? ?Su Monks, CCC-SLP ?01/02/2022, 8:56 AM ? ? ? ?  ?

## 2022-01-09 ENCOUNTER — Ambulatory Visit: Payer: Medicare HMO | Admitting: Speech Pathology

## 2022-01-11 NOTE — Telephone Encounter (Signed)
Per [facility], patient received shipment of Ofev on 12/25/21. Patient received a 90 day supply. ? ?Phone# [phone] ? ?Knox Saliva, PharmD, MPH, BCPS, CPP ?Clinical Pharmacist (Rheumatology and Pulmonology) ?

## 2022-01-14 ENCOUNTER — Telehealth (HOSPITAL_COMMUNITY): Payer: Self-pay | Admitting: *Deleted

## 2022-01-14 NOTE — Telephone Encounter (Signed)
Attempted to contact patient to schedule OP MBS. Left VM. RKEEL 

## 2022-01-16 ENCOUNTER — Ambulatory Visit: Payer: Medicare HMO | Admitting: Speech Pathology

## 2022-01-16 DIAGNOSIS — K219 Gastro-esophageal reflux disease without esophagitis: Secondary | ICD-10-CM | POA: Diagnosis not present

## 2022-01-16 DIAGNOSIS — J3489 Other specified disorders of nose and nasal sinuses: Secondary | ICD-10-CM | POA: Diagnosis not present

## 2022-01-16 DIAGNOSIS — J383 Other diseases of vocal cords: Secondary | ICD-10-CM | POA: Diagnosis not present

## 2022-01-16 DIAGNOSIS — Z8673 Personal history of transient ischemic attack (TIA), and cerebral infarction without residual deficits: Secondary | ICD-10-CM | POA: Diagnosis not present

## 2022-01-23 ENCOUNTER — Encounter: Payer: Medicare HMO | Admitting: Speech Pathology

## 2022-01-24 ENCOUNTER — Telehealth (HOSPITAL_COMMUNITY): Payer: Self-pay

## 2022-01-24 NOTE — Telephone Encounter (Signed)
Called and spoke with patient to schedule OP MBS - patient stated he has more issues going on that are more important. Patient would like to decline test at this time. Informed patient that if he would like to schedule OP MBS to contact MD to place new order. ?

## 2022-02-01 ENCOUNTER — Encounter: Payer: Medicare HMO | Admitting: Speech Pathology

## 2022-02-01 ENCOUNTER — Other Ambulatory Visit: Payer: Self-pay | Admitting: Gastroenterology

## 2022-02-01 DIAGNOSIS — Z8679 Personal history of other diseases of the circulatory system: Secondary | ICD-10-CM | POA: Diagnosis not present

## 2022-02-01 DIAGNOSIS — R1314 Dysphagia, pharyngoesophageal phase: Secondary | ICD-10-CM | POA: Diagnosis not present

## 2022-02-01 DIAGNOSIS — R490 Dysphonia: Secondary | ICD-10-CM | POA: Diagnosis not present

## 2022-02-01 DIAGNOSIS — J8410 Pulmonary fibrosis, unspecified: Secondary | ICD-10-CM | POA: Diagnosis not present

## 2022-02-01 DIAGNOSIS — R634 Abnormal weight loss: Secondary | ICD-10-CM | POA: Diagnosis not present

## 2022-02-04 ENCOUNTER — Telehealth: Payer: Self-pay | Admitting: *Deleted

## 2022-02-04 NOTE — Telephone Encounter (Signed)
Patient is not on Plavix for cardiac stenting. Chart review reveals history of a stroke in 2017. Please forward request to hold Plavix to patient's neurologist.  Emmaline Life, NP-C    02/04/2022, 11:35 AM [facility] [address], [address] Office [phone] Fax [phone]

## 2022-02-04 NOTE — Telephone Encounter (Signed)
   Pre-operative Risk Assessment    Patient Name: Thomas Bond  DOB: [birth year] MRN: [id number]      Request for Surgical Clearance    Procedure:   EGD with dilatation   Date of Surgery:  Clearance 04/16/22                                 Surgeon:  dr Alessandra Bevels Surgeon's Group or Practice Name:  [facility] Phone number:  [phone] Fax number:  [phone]   Type of Clearance Requested:   - Pharmacy:  Hold Clopidogrel (Plavix) hold 5 days prior to procedure   Type of Anesthesia:  Not Indicated   Additional requests/questions:    Arman Filter   02/04/2022, 10:21 AM

## 2022-02-06 DIAGNOSIS — I10 Essential (primary) hypertension: Secondary | ICD-10-CM | POA: Diagnosis not present

## 2022-02-06 DIAGNOSIS — R7309 Other abnormal glucose: Secondary | ICD-10-CM | POA: Diagnosis not present

## 2022-02-06 DIAGNOSIS — N182 Chronic kidney disease, stage 2 (mild): Secondary | ICD-10-CM | POA: Diagnosis not present

## 2022-02-06 DIAGNOSIS — R5383 Other fatigue: Secondary | ICD-10-CM | POA: Diagnosis not present

## 2022-02-12 ENCOUNTER — Ambulatory Visit: Payer: Medicare HMO | Admitting: Pulmonary Disease

## 2022-02-12 ENCOUNTER — Encounter: Payer: Self-pay | Admitting: Pulmonary Disease

## 2022-02-12 VITALS — BP 128/72 | HR 58 | Ht 66.0 in | Wt 147.2 lb

## 2022-02-12 DIAGNOSIS — J8410 Pulmonary fibrosis, unspecified: Secondary | ICD-10-CM | POA: Diagnosis not present

## 2022-02-12 NOTE — Progress Notes (Signed)
Synopsis: Referred in December 2022 for abnormal chest x-ray by Holland Commons, FNP  Subjective:   PATIENT ID: Thomas Bond GENDER: male DOB: 10/21/1940, MRN: [id number]  HPI  Chief Complaint  Patient presents with   Follow-up    2 mo f/u after starting Ofev. Increased SOB, fatigue and wheezing. Currently on Ofev. Side effects have been minimal.    Thomas Bond is an 81 year old male, never smoker with GERD, hypertension, dysphagia and history of stroke who returns to pulmonary clinic for pulmonary fibrosis.   He was seen by ENT on 01/16/22, note reviewed. No significant findings on flexible laryngoscopy to explain his hoarseness of voice.   He saw Speech Therapy 01/01/22 who recommended updated MBS and ongoing therapy for pharyngeal strengthening.   He started on ofev 173m 1 tab twice daily since last visit. He has lost 8lbs since last visit. He has lost 14lbs since the beginning of the year. Since starting ofev he has decreased appetite and is having loose stools 2-3 times per day.   OV 12/21/21 He met with our pharmacy team on 10/17/21 and has not received Esbriet yet.  He had modified barium swallow as well as esophagram performed in January 2023 where he was noted to have the barium tablet lodged in the left piriform sinus.  He continues to have hoarse voice and trouble swallowing.  He is scheduled to see ENT in May.  Pulmonary function test 10/12/2021 showed mixed obstructive and restrictive defects along with significant bronchodilator response and moderate diffusion defect.  I reviewed his HRCT chest scan findings and pulmonary function test findings with the patient and his wife.  We had detailed discussion about the progression of pulmonary fibrosis and thinking about their advanced directives.  OV 10/12/21 HRCT Chest 08/24/21 shows evidence of usual interstitial pneumonia with widespread areas of ground glass atenuation, septal thickening, subpleural reticulation,  traction bronchiectasis and peripheral bronchiolectasis with very mild honeycombing in the lung bases.   PFTs today show mild restriction with moderate diffusion defect.   He [facility] not [facility].   He continues to have hoarseness of voice which is new over recent months. MBS from 2019 noted from office visit 08/17/21 with anterior cervical spine intrusion of pharynx/esophagus.  OV 08/17/21 Patient reports having shortness of breath over the past couple of years.  He has been tried on Asmanex as needed which he did not notice much improvement in his symptoms.  He has issues with dysphagia and has been evaluated by speech therapy multiple times over the past years.  Modified barium swallow from 2019 which shows mild pharyngeal cervical esophageal dysphagia due to mechanical obstruction from anterior cervical spine.  No aspiration observed and trace laryngeal penetration of thin liquids from vallecular residuals spilling into open airway cleared with further swallow.  Epiglottic deflection decreased likely due to cervical spine protrusion into pharynx which contributes to pharyngeal residuals.  He denies any issues with frequent pneumonias over the past years.  He denies any cough but does report wheezing.  He does report hoarseness in his voice which has been more constant over recent months.  He denies any seasonal allergies.  He denies any sinus congestion or postnasal drainage.  He is a never smoker but is exposed to significant secondhand smoke growing up as his parents smoked in the household.  He worked for a cHuman resources officeras an aOptometristand denies any significant exposure to the chemicals.  He complains that  his feet feel swollen in the morning time but when he checks them they do not appear swollen.  Past Medical History:  Diagnosis Date   Aortic atherosclerosis (HCC)    GERD (gastroesophageal reflux disease)    Gout    Hypertension    Sleep apnea 2013    Not CPAP   TIA      Family History  Problem Relation Age of Onset   Alzheimer's disease Mother    Heart attack Brother 90   Stroke Neg Hx      Social History   Socioeconomic History   Marital status: Married    Spouse name: Not on file   Number of children: Not on file   Years of education: Not on file   Highest education level: Not on file  Occupational History   Not on file  Tobacco Use   Smoking status: Never   Smokeless tobacco: Never  Substance and Sexual Activity   Alcohol use: Yes    Comment: occasionally beer on weekends   Drug use: No   Sexual activity: Not on file  Other Topics Concern   Not on file  Social History Narrative   Lives with wife.  4 children over two marriages.  Retired Psychiatric nurse of Radio broadcast assistant Strain: Not on [employer] Insecurity: Not on file  Transportation Needs: Not on file  Physical Activity: Not on file  Stress: Not on file  Social Connections: Not on file  Intimate Partner Violence: Not on file     No Known Allergies   Outpatient Medications Prior to Visit  Medication Sig Dispense Refill   allopurinol (ZYLOPRIM) 300 MG tablet Take 1 tablet by mouth daily.     amLODipine (NORVASC) 5 MG tablet Take 1 tablet by mouth daily.     atenolol (TENORMIN) 50 MG tablet Take 1 tablet by mouth daily.     cetirizine (ZYRTEC) 10 MG tablet 1 tablet     clopidogrel (PLAVIX) 75 MG tablet Take 1 tablet by mouth daily.     famotidine (PEPCID) 20 MG tablet Take 20 mg by mouth daily.     hydrochlorothiazide (MICROZIDE) 12.5 MG capsule Take 1 capsule by mouth daily.     levothyroxine (SYNTHROID) 25 MCG tablet Take 1 tablet by mouth daily.     Nintedanib (OFEV) 150 MG CAPS Take 1 capsule (150 mg total) by mouth 2 (two) times daily. 180 capsule 1   omeprazole (PRILOSEC) 20 MG capsule Take 1 capsule by mouth 2 (two) times daily.     sertraline (ZOLOFT) 50 MG tablet 1 tablet     atorvastatin (LIPITOR) 20 MG tablet  Take 1 tablet (20 mg total) by mouth daily. (Patient not taking: Reported on 01/01/2022) 90 tablet 3   fluticasone furoate-vilanterol (BREO ELLIPTA) 100-25 MCG/ACT AEPB Inhale 1 puff into the lungs daily. (Patient not taking: Reported on 01/01/2022) 28 each 3   No facility-administered medications prior to visit.   Review of Systems  Constitutional:  Negative for chills, fever, malaise/fatigue and weight loss.  HENT:  Negative for congestion, sinus pain and sore throat.        Difficulty swallowing  Eyes: Negative.   Respiratory:  Positive for cough and shortness of breath. Negative for hemoptysis, sputum production and wheezing.   Cardiovascular:  Negative for chest pain, palpitations, orthopnea, claudication and leg swelling.  Gastrointestinal:  Negative for abdominal pain, heartburn, nausea and vomiting.  Genitourinary: Negative.   Musculoskeletal:  Negative  for joint pain and myalgias.  Skin:  Negative for rash.  Neurological:  Negative for weakness.  Endo/Heme/Allergies: Negative.   Psychiatric/Behavioral: Negative.     Objective:   Vitals:   02/12/22 1401  BP: 128/72  Pulse: (!) 58  SpO2: 99%  Weight: 147 lb 3.2 oz (66.8 kg)  Height: _0  (1.676 m)    Physical Exam Constitutional:      General: He is not in acute distress. HENT:     Head: Normocephalic and atraumatic.  Eyes:     Conjunctiva/sclera: Conjunctivae normal.  Cardiovascular:     Rate and Rhythm: Normal rate and regular rhythm.     Pulses: Normal pulses.     Heart sounds: Normal heart sounds. No murmur heard. Pulmonary:     Breath sounds: Rales (Bibasilar) present.  Musculoskeletal:     Right lower leg: No edema.     Left lower leg: No edema.  Skin:    General: Skin is warm and dry.  Neurological:     General: No focal deficit present.     Mental Status: He is alert.  Psychiatric:        Mood and Affect: Mood normal.        Behavior: Behavior normal.        Thought Content: Thought content normal.         Judgment: Judgment normal.   CBC    Component Value Date/Time   WBC 10.0 05/22/2016 1829   RBC 4.61 05/22/2016 1829   HGB 16.3 05/22/2016 1838   HCT 48.0 05/22/2016 1838   PLT 255 05/22/2016 1829   MCV 94.8 05/22/2016 1829   MCH 32.3 05/22/2016 1829   MCHC 34.1 05/22/2016 1829   RDW 14.1 05/22/2016 1829   LYMPHSABS 3.8 05/22/2016 1829   MONOABS 0.9 05/22/2016 1829   EOSABS 0.6 05/22/2016 1829   BASOSABS 0.1 05/22/2016 1829      Latest Ref Rng & Units 05/22/2016    6:38 PM 05/22/2016    6:29 PM  BMP  Glucose 65 - 99 mg/dL 94   95    BUN 6 - 20 mg/dL 14   12    Creatinine 0.61 - 1.24 mg/dL 1.20   1.28    Sodium 135 - 145 mmol/L 140   137    Potassium 3.5 - 5.1 mmol/L 3.5   3.4    Chloride 101 - 111 mmol/L 100   100    CO2 22 - 32 mmol/L  25    Calcium 8.9 - 10.3 mg/dL  9.5     Chest imaging: HRCT Chest 08/24/21 1. The appearance of the lungs is considered diagnostic of usual interstitial pneumonia (UIP) per current ATS guidelines. 2. Aortic atherosclerosis, in addition to left main and 3 vessel coronary artery disease. 3. There are calcifications of the aortic valve. Echocardiographic correlation for evaluation of potential valvular dysfunction may be warranted if clinically indicated. 4. Morphologic changes in the liver, suggestive of underlying cirrhosis.  PFT:    Latest Ref Rng & Units 10/12/2021   12:51 PM  PFT Results  FVC-Pre L 2.35    FVC-Predicted Pre % 73    FVC-Post L 2.71    FVC-Predicted Post % 84    Pre FEV1/FVC % % 47    Post FEV1/FCV % % 73    FEV1-Pre L 1.11    FEV1-Predicted Pre % 49    FEV1-Post L 1.97    DLCO uncorrected ml/min/mmHg 11.35    DLCO UNC% %  54    DLCO corrected ml/min/mmHg 11.35    DLCO COR %Predicted % 54    DLVA Predicted % 69    TLC L 4.38    TLC % Predicted % 71    RV % Predicted % 80    2023: Mild restriction, moderate diffusion defect  Labs:  Path:  Echo:  Heart Catheterization:  Assessment & Plan:    Pulmonary fibrosis (HCC) - Plan: Pulse oximetry, overnight  Discussion: Thomas Bond is an 81 year old male, never smoker with GERD, hypertension, dysphagia and history of stroke who returns to pulmonary clinic for pulmonary fibrosis.   Patient's high resolution CT Chest is diagnostic of pulmonary fibrosis with usual interstitial pattern. He has been started on Ofev 12/2021 and has tolerated it well so far. He does have on going weight loss since the beginning of the year. He has lack of appetite since starting ofev and loose stools. We will continue therapy for now but we discussed if he has further issues with weight loss we will need to discontinue the medication. I have encouraged him to take protein drinks, at least 2 per day to help supplement his nutrition.   On simple walk today he did not desaturate below 88%.   He is to follow up with speech for MBS and short course of therapy to work on pharyngeal strengthening exercises.   Follow-up in 3 months.  Freda Jackson, MD [facility] Office: [phone]   Current Outpatient Medications:    allopurinol (ZYLOPRIM) 300 MG tablet, Take 1 tablet by mouth daily., Disp: , Rfl:    amLODipine (NORVASC) 5 MG tablet, Take 1 tablet by mouth daily., Disp: , Rfl:    atenolol (TENORMIN) 50 MG tablet, Take 1 tablet by mouth daily., Disp: , Rfl:    cetirizine (ZYRTEC) 10 MG tablet, 1 tablet, Disp: , Rfl:    clopidogrel (PLAVIX) 75 MG tablet, Take 1 tablet by mouth daily., Disp: , Rfl:    famotidine (PEPCID) 20 MG tablet, Take 20 mg by mouth daily., Disp: , Rfl:    hydrochlorothiazide (MICROZIDE) 12.5 MG capsule, Take 1 capsule by mouth daily., Disp: , Rfl:    levothyroxine (SYNTHROID) 25 MCG tablet, Take 1 tablet by mouth daily., Disp: , Rfl:    Nintedanib (OFEV) 150 MG CAPS, Take 1 capsule (150 mg total) by mouth 2 (two) times daily., Disp: 180 capsule, Rfl: 1   omeprazole (PRILOSEC) 20 MG capsule, Take 1 capsule by  mouth 2 (two) times daily., Disp: , Rfl:    sertraline (ZOLOFT) 50 MG tablet, 1 tablet, Disp: , Rfl:

## 2022-02-12 NOTE — Patient Instructions (Addendum)
Continue on Ofev 1 tab twice daily  Recommend drinking a protein drink one to two times daily - Boost or Ensure are good brands  We will check your oxygen while walking today we will also check an overnight oximetry test

## 2022-02-13 DIAGNOSIS — I10 Essential (primary) hypertension: Secondary | ICD-10-CM | POA: Diagnosis not present

## 2022-02-13 DIAGNOSIS — I2510 Atherosclerotic heart disease of native coronary artery without angina pectoris: Secondary | ICD-10-CM | POA: Diagnosis not present

## 2022-02-13 DIAGNOSIS — K219 Gastro-esophageal reflux disease without esophagitis: Secondary | ICD-10-CM | POA: Diagnosis not present

## 2022-02-13 DIAGNOSIS — Z0000 Encounter for general adult medical examination without abnormal findings: Secondary | ICD-10-CM | POA: Diagnosis not present

## 2022-02-13 DIAGNOSIS — R7309 Other abnormal glucose: Secondary | ICD-10-CM | POA: Diagnosis not present

## 2022-02-13 DIAGNOSIS — J8410 Pulmonary fibrosis, unspecified: Secondary | ICD-10-CM | POA: Diagnosis not present

## 2022-02-13 DIAGNOSIS — E039 Hypothyroidism, unspecified: Secondary | ICD-10-CM | POA: Diagnosis not present

## 2022-02-13 DIAGNOSIS — G4733 Obstructive sleep apnea (adult) (pediatric): Secondary | ICD-10-CM | POA: Diagnosis not present

## 2022-02-17 ENCOUNTER — Encounter: Payer: Self-pay | Admitting: Pulmonary Disease

## 2022-02-21 DIAGNOSIS — J8410 Pulmonary fibrosis, unspecified: Secondary | ICD-10-CM | POA: Diagnosis not present

## 2022-02-25 ENCOUNTER — Telehealth: Payer: Self-pay | Admitting: Pulmonary Disease

## 2022-02-25 NOTE — Telephone Encounter (Signed)
Reviewed ONO results from [facility]. Will place results in Dr. August Albino review folder for when he returns next week.

## 2022-02-28 DIAGNOSIS — H6123 Impacted cerumen, bilateral: Secondary | ICD-10-CM | POA: Diagnosis not present

## 2022-03-11 ENCOUNTER — Telehealth: Payer: Self-pay | Admitting: Pulmonary Disease

## 2022-03-12 NOTE — Telephone Encounter (Signed)
Called patient this morning and he is wanting his results of his overnight oxygen test.   Please advise Dr Francine Graven

## 2022-03-16 NOTE — Telephone Encounter (Signed)
Please let patient know he did not have any oxygen desaturations below 88% when sleeping. He does not need supplemental oxygen when sleeping.  Thanks, JD

## 2022-03-16 NOTE — Telephone Encounter (Signed)
Please see other phone note regarding ONO results.  Thanks, JD

## 2022-03-18 NOTE — Telephone Encounter (Signed)
Called and spoke with patient. He verbalized understanding of results. Nothing further needed at time of call.

## 2022-03-18 NOTE — Telephone Encounter (Signed)
Duplicate encounter

## 2022-03-20 DIAGNOSIS — R1310 Dysphagia, unspecified: Secondary | ICD-10-CM | POA: Diagnosis not present

## 2022-03-20 DIAGNOSIS — H903 Sensorineural hearing loss, bilateral: Secondary | ICD-10-CM | POA: Diagnosis not present

## 2022-03-20 DIAGNOSIS — R42 Dizziness and giddiness: Secondary | ICD-10-CM | POA: Diagnosis not present

## 2022-03-20 DIAGNOSIS — R221 Localized swelling, mass and lump, neck: Secondary | ICD-10-CM | POA: Diagnosis not present

## 2022-03-21 ENCOUNTER — Telehealth: Payer: Self-pay | Admitting: Pulmonary Disease

## 2022-03-21 DIAGNOSIS — J8410 Pulmonary fibrosis, unspecified: Secondary | ICD-10-CM

## 2022-03-21 DIAGNOSIS — R0602 Shortness of breath: Secondary | ICD-10-CM

## 2022-03-22 NOTE — Telephone Encounter (Signed)
Attempted to call pt but unable to reach. Unable to leave VM as no mailbox ever kicked  in. Will try to call back later.

## 2022-03-26 NOTE — Telephone Encounter (Signed)
Called and spoke with pt who states he is wanting to get a rolling walker with a seat on it through his insurance company. Dr. Erin Fulling, please advise if you are okay with [place] ordering this through DME order. An addendum to last OV might need to be done in regards to why pt would benefit from having the walker.

## 2022-03-27 NOTE — Telephone Encounter (Signed)
Called and left voicemail for patient to call office back. Need information on what kind of walker patient is wanting.

## 2022-03-27 NOTE — Telephone Encounter (Signed)
Ok to send script for a walker. Please let me know what I need to write in last note as addendum if needed.  Thanks, JD

## 2022-03-28 NOTE — Telephone Encounter (Signed)
I spoke with the pt  He wanted rolling walker with seat  I placed order  I have sent community msg to [employer] to see if the note needs to be addended  Will route to me for f/u on this

## 2022-04-04 DIAGNOSIS — B351 Tinea unguium: Secondary | ICD-10-CM | POA: Diagnosis not present

## 2022-04-04 DIAGNOSIS — L570 Actinic keratosis: Secondary | ICD-10-CM | POA: Diagnosis not present

## 2022-04-04 DIAGNOSIS — L821 Other seborrheic keratosis: Secondary | ICD-10-CM | POA: Diagnosis not present

## 2022-04-04 DIAGNOSIS — L218 Other seborrheic dermatitis: Secondary | ICD-10-CM | POA: Diagnosis not present

## 2022-04-09 ENCOUNTER — Encounter (HOSPITAL_COMMUNITY): Payer: Self-pay | Admitting: Gastroenterology

## 2022-04-09 ENCOUNTER — Telehealth: Payer: Self-pay | Admitting: Pulmonary Disease

## 2022-04-11 NOTE — Telephone Encounter (Signed)
Spoke with pt and advise we had no idea why [employer] would be texting him. Informed the pt it may be r/t insurance or payment and pt advised to call [employer] back. Nothing further needed at this time.

## 2022-04-11 NOTE — Telephone Encounter (Signed)
Called and left voicemail for patient to call office back  

## 2022-04-15 NOTE — Anesthesia Preprocedure Evaluation (Signed)
Anesthesia Evaluation  Patient identified by MRN, date of birth, ID band Patient awake    Reviewed: Allergy & Precautions, NPO status , Patient's Chart, lab work & pertinent test results  Airway Mallampati: II  TM Distance: >3 FB Neck ROM: Full    Dental no notable dental hx. (+) Teeth Intact, Dental Advisory Given   Pulmonary sleep apnea ,  Pulmonary Fibrosis   Pulmonary exam normal breath sounds clear to auscultation       Cardiovascular hypertension, + CAD  Normal cardiovascular exam Rhythm:Regular Rate:Normal  10/2021 Echo 1. Left ventricular ejection fraction, by estimation, is 60 to 65%. The  left ventricle has normal function. The left ventricle has no regional  wall motion abnormalities. There is mild concentric left ventricular  hypertrophy. Left ventricular diastolic  parameters are consistent with Grade I diastolic dysfunction (impaired  relaxation).  2. Right ventricular systolic function is normal. The right ventricular  size is normal. There is normal pulmonary artery systolic pressure. The  estimated right ventricular systolic pressure is 24.2 mmHg.  3. The mitral valve is normal in structure. Trivial mitral valve  regurgitation. No evidence of mitral stenosis.  4. The aortic valve is calcified. There is severe calcifcation of the  aortic valve. There is severe thickening of the aortic valve. Aortic valve  regurgitation is trivial. Mild to moderate aortic valve stenosis. Aortic  valve area, by VTI measures 1.38  cm. Aortic valve mean gradient measures 14.4 mmHg. Aortic valve Vmax  measures 2.55 m/s.  5. The inferior vena cava is normal in size with greater than 50%  respiratory variability, suggesting right atrial pressure of 3 mmHg.    Neuro/Psych TIA   GI/Hepatic GERD  ,  Endo/Other    Renal/GU negative Renal ROS     Musculoskeletal   Abdominal   Peds  Hematology   Anesthesia Other  Findings   Reproductive/Obstetrics                            Anesthesia Physical Anesthesia Plan  ASA: 3  Anesthesia Plan: MAC   Post-op Pain Management:    Induction: Intravenous  PONV Risk Score and Plan: Treatment may vary due to age or medical condition  Airway Management Planned: Nasal Cannula and Natural Airway  Additional Equipment: None  Intra-op Plan:   Post-operative Plan:   Informed Consent: I have reviewed the patients History and Physical, chart, labs and discussed the procedure including the risks, benefits and alternatives for the proposed anesthesia with the patient or authorized representative who has indicated his/her understanding and acceptance.     Dental advisory given  Plan Discussed with:   Anesthesia Plan Comments: (EGD for dysphagia)       Anesthesia Quick Evaluation

## 2022-04-16 ENCOUNTER — Encounter (HOSPITAL_COMMUNITY)
Admission: RE | Disposition: A | Discharge status: Home / Self Care | Payer: Self-pay | Source: Home / Self Care | Attending: Gastroenterology

## 2022-04-16 ENCOUNTER — Ambulatory Visit (HOSPITAL_BASED_OUTPATIENT_CLINIC_OR_DEPARTMENT_OTHER): Payer: Medicare HMO | Admitting: Anesthesiology

## 2022-04-16 ENCOUNTER — Other Ambulatory Visit: Payer: Self-pay

## 2022-04-16 ENCOUNTER — Encounter (HOSPITAL_COMMUNITY): Payer: Self-pay | Admitting: Gastroenterology

## 2022-04-16 ENCOUNTER — Ambulatory Visit (HOSPITAL_COMMUNITY)
Admission: RE | Admit: 2022-04-16 | Discharge: 2022-04-16 | Disposition: A | Discharge status: Home / Self Care | Payer: Medicare HMO | Attending: Gastroenterology | Admitting: Gastroenterology

## 2022-04-16 ENCOUNTER — Ambulatory Visit (HOSPITAL_COMMUNITY): Payer: Medicare HMO | Admitting: Anesthesiology

## 2022-04-16 DIAGNOSIS — K31A0 Gastric intestinal metaplasia, unspecified: Secondary | ICD-10-CM | POA: Diagnosis not present

## 2022-04-16 DIAGNOSIS — Q394 Esophageal web: Secondary | ICD-10-CM | POA: Insufficient documentation

## 2022-04-16 DIAGNOSIS — K317 Polyp of stomach and duodenum: Secondary | ICD-10-CM | POA: Diagnosis not present

## 2022-04-16 DIAGNOSIS — R1313 Dysphagia, pharyngeal phase: Secondary | ICD-10-CM | POA: Insufficient documentation

## 2022-04-16 DIAGNOSIS — I2510 Atherosclerotic heart disease of native coronary artery without angina pectoris: Secondary | ICD-10-CM | POA: Insufficient documentation

## 2022-04-16 DIAGNOSIS — J8410 Pulmonary fibrosis, unspecified: Secondary | ICD-10-CM | POA: Insufficient documentation

## 2022-04-16 DIAGNOSIS — K2970 Gastritis, unspecified, without bleeding: Secondary | ICD-10-CM | POA: Insufficient documentation

## 2022-04-16 DIAGNOSIS — K219 Gastro-esophageal reflux disease without esophagitis: Secondary | ICD-10-CM | POA: Diagnosis not present

## 2022-04-16 DIAGNOSIS — G4730 Sleep apnea, unspecified: Secondary | ICD-10-CM | POA: Diagnosis not present

## 2022-04-16 DIAGNOSIS — Z7902 Long term (current) use of antithrombotics/antiplatelets: Secondary | ICD-10-CM | POA: Diagnosis not present

## 2022-04-16 DIAGNOSIS — I10 Essential (primary) hypertension: Secondary | ICD-10-CM | POA: Insufficient documentation

## 2022-04-16 DIAGNOSIS — K2960 Other gastritis without bleeding: Secondary | ICD-10-CM | POA: Diagnosis not present

## 2022-04-16 DIAGNOSIS — K449 Diaphragmatic hernia without obstruction or gangrene: Secondary | ICD-10-CM | POA: Diagnosis not present

## 2022-04-16 HISTORY — PX: POLYPECTOMY: SHX5525

## 2022-04-16 HISTORY — PX: BIOPSY: SHX5522

## 2022-04-16 HISTORY — PX: SAVORY DILATION: SHX5439

## 2022-04-16 HISTORY — PX: HEMOSTASIS CLIP PLACEMENT: SHX6857

## 2022-04-16 HISTORY — PX: ESOPHAGOGASTRODUODENOSCOPY (EGD) WITH PROPOFOL: SHX5813

## 2022-04-16 SURGERY — ESOPHAGOGASTRODUODENOSCOPY (EGD) WITH PROPOFOL
Anesthesia: Monitor Anesthesia Care

## 2022-04-16 MED ORDER — LACTATED RINGERS IV SOLN
INTRAVENOUS | Status: AC | PRN
  Administered 2022-04-16: 1000 mL via INTRAVENOUS

## 2022-04-16 MED ORDER — PHENYLEPHRINE 80 MCG/ML (10ML) SYRINGE FOR IV PUSH (FOR BLOOD PRESSURE SUPPORT)
PREFILLED_SYRINGE | INTRAVENOUS | Status: DC | PRN
  Administered 2022-04-16: 160 ug via INTRAVENOUS

## 2022-04-16 MED ORDER — PROPOFOL 10 MG/ML IV BOLUS
INTRAVENOUS | Status: DC | PRN
  Administered 2022-04-16: 30 mg via INTRAVENOUS
  Administered 2022-04-16 (×2): 20 mg via INTRAVENOUS
  Administered 2022-04-16 (×3): 10 mg via INTRAVENOUS

## 2022-04-16 MED ORDER — PANTOPRAZOLE SODIUM 40 MG PO TBEC: 40.0000 mg | DELAYED_RELEASE_TABLET | Freq: Two times a day (BID) | ORAL | 1 refills | Status: AC

## 2022-04-16 MED ORDER — PROPOFOL 500 MG/50ML IV EMUL
INTRAVENOUS | Status: DC | PRN
  Administered 2022-04-16: 125 ug/kg/min via INTRAVENOUS

## 2022-04-16 MED ORDER — SODIUM CHLORIDE 0.9 % IV SOLN: INTRAVENOUS | Status: DC

## 2022-04-16 MED ORDER — LIDOCAINE HCL (CARDIAC) PF 100 MG/5ML IV SOSY
PREFILLED_SYRINGE | INTRAVENOUS | Status: DC | PRN
  Administered 2022-04-16: 100 mg via INTRAVENOUS

## 2022-04-16 SURGICAL SUPPLY — 15 items

## 2022-04-16 NOTE — Discharge Instructions (Signed)
RESUME PLAVIX ON 04/21/22.  YOU HAD AN ENDOSCOPIC PROCEDURE TODAY: Refer to the procedure report and other information in the discharge instructions given to you for any specific questions about what was found during the examination. If this information does not answer your questions, please call the [facility] office at [phone] to clarify.   YOU SHOULD EXPECT: Some feelings of bloating in the abdomen. Passage of more gas than usual. Walking can help get rid of the air that was put into your GI tract during the procedure and reduce the bloating. If you had a lower endoscopy (such as a colonoscopy or flexible sigmoidoscopy) you may notice spotting of blood in your stool or on the toilet paper. Some abdominal soreness may be present for a day or two, also.  DIET: Your first meal following the procedure should be a light meal and then it is ok to progress to your normal diet. A half-sandwich or bowl of soup is an example of a good first meal. Heavy or fried foods are harder to digest and may make you feel nauseous or bloated. Drink plenty of fluids but you should avoid alcoholic beverages for 24 hours. If you had a esophageal dilation, please see attached instructions for diet.    ACTIVITY: Your care partner should take you home directly after the procedure. You should plan to take it easy, moving slowly for the rest of the day. You can resume normal activity the day after the procedure however YOU SHOULD NOT DRIVE, use power tools, machinery or perform tasks that involve climbing or major physical exertion for 24 hours (because of the sedation medicines used during the test).   SYMPTOMS TO REPORT IMMEDIATELY: A gastroenterologist can be reached at any hour. Please call [phone]  for any of the following symptoms:  Following lower endoscopy (colonoscopy, flexible sigmoidoscopy) Excessive amounts of blood in the stool  Significant tenderness, worsening of abdominal pains  Swelling of the abdomen that  is new, acute  Fever of 100 or higher  Following upper endoscopy (EGD, EUS, ERCP, esophageal dilation) Vomiting of blood or coffee ground material  New, significant abdominal pain  New, significant chest pain or pain under the shoulder blades  Painful or persistently difficult swallowing  New shortness of breath  Black, tarry-looking or red, bloody stools  FOLLOW UP:  If any biopsies were taken you will be contacted by phone or by letter within the next 1-3 weeks. Call [phone]  if you have not heard about the biopsies in 3 weeks.  Please also call with any specific questions about appointments or follow up tests.

## 2022-04-16 NOTE — Op Note (Signed)
[facility] Patient Name: Thomas Bond Procedure Date: 04/16/2022 MRN: [id number] Attending MD: Otis Brace , MD Date of Birth: Jan 17, 1941 CSN: [id number] Age: 81 Admit Type: Inpatient Procedure:                Upper GI endoscopy Indications:              Pharyngeal phase dysphagia, Dysphagia Providers:                Otis Brace, MD, Kary Kos RN, RN, Benetta Spar, Technician Referring MD:             Otis Brace, MD Medicines:                Sedation Administered by an Anesthesia Professional Complications:            No immediate complications. Estimated Blood Loss:     Estimated blood loss was minimal. Procedure:                Pre-Anesthesia Assessment:                           - Prior to the procedure, a History and Physical                            was performed, and patient medications and                            allergies were reviewed. The patient's tolerance of                            previous anesthesia was also reviewed. The risks                            and benefits of the procedure and the sedation                            options and risks were discussed with the patient.                            All questions were answered, and informed consent                            was obtained. Prior Anticoagulants: The patient has                            taken Plavix (clopidogrel), last dose was 5 days                            prior to procedure. ASA Grade Assessment: III - A                            patient with severe systemic disease. After  reviewing the risks and benefits, the patient was                            deemed in satisfactory condition to undergo the                            procedure.                           After obtaining informed consent, the endoscope was                            passed under direct vision. Throughout the                             procedure, the patient's blood pressure, pulse, and                            oxygen saturations were monitored continuously. The                            GIF-H190 ([id number]) Olympus endoscope was introduced                            through the mouth, and advanced to the second part                            of duodenum. The upper GI endoscopy was                            accomplished without difficulty. The patient                            tolerated the procedure well. Scope In: Scope Out: Findings:      A web was found at the cricopharyngeus. A guidewire was placed and the       scope was withdrawn. Dilation was performed with a Savary dilator with       no resistance at 15 mm. The dilation site was examined following       endoscope reinsertion and showed no bleeding, mucosal tear or       perforation.      The Z-line was regular and was found 37 cm from the incisors.      A small hiatal hernia was present.      Three 10 to 20 mm pedunculated and sessile polyps with stigmata of       recent bleeding were found in the cardia and in the gastric antrum.       These polyps were removed with a piecemeal technique using a hot snare.       Resection and retrieval were complete. To prevent bleeding after the       polypectomy, two hemostatic clips were successfully placed. There was no       bleeding at the end of the procedure.      Scattered mild inflammation characterized by congestion (edema) and       erythema was found in the gastric  antrum. Biopsies were taken with a       cold forceps for histology.      The duodenal bulb, first portion of the duodenum and second portion of       the duodenum were normal. 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 Dilated.                           - Z-line regular, 37 cm from the incisors.                           - Small hiatal hernia.                           - Three gastric polyps. Resected and retrieved.                             Clips were placed.                           - Gastritis. Biopsied.                           - Normal duodenal bulb, first portion of the                            duodenum and second portion of the duodenum. Moderate Sedation:      Moderate (conscious) sedation was personally administered by an       anesthesia professional. The following parameters were monitored: oxygen       saturation, heart rate, blood pressure, and response to care. Recommendation:           - Patient has a contact number available for                            emergencies. The signs and symptoms of potential                            delayed complications were discussed with the                            patient. Return to normal activities tomorrow.                            Written discharge instructions were provided to the                            patient.                           - Full liquid diet today.                           - Continue present medications.                           -  Resume Plavix (clopidogrel) at prior dose in 5                            days.                           - Await pathology results.                           - Use Protonix (pantoprazole) 40 mg PO BID for 4                            weeks.                           - Return to my office PRN. Procedure Code(s):        --- Professional ---                           [phone], Esophagogastroduodenoscopy, flexible,                            transoral; with removal of tumor(s), polyp(s), or                            other lesion(s) by snare technique                           43248, Esophagogastroduodenoscopy, flexible,                            transoral; with insertion of guide wire followed by                            passage of dilator(s) through esophagus over guide                            wire                           43239, 26, Esophagogastroduodenoscopy, flexible,                             transoral; with biopsy, single or multiple Diagnosis Code(s):        --- Professional ---                           Q39.4, Esophageal web                           K44.9, Diaphragmatic hernia without obstruction or                            gangrene                           K31.7, Polyp of stomach and duodenum  K29.70, Gastritis, unspecified, without bleeding                           R13.13, Dysphagia, pharyngeal phase CPT copyright 2019 [employer]. All rights reserved. The codes documented in this report are preliminary and upon coder review may  be revised to meet current compliance requirements. Otis Brace, MD Otis Brace, MD 04/16/2022 11:12:38 AM Number of Addenda: 0

## 2022-04-16 NOTE — H&P (Signed)
Primary Care Physician:  Holland Commons, FNP Primary Gastroenterologist:  [facility]  Reason for Visit : Dysphagia  HPI: Thomas Bond is a 81 y.o. male with past medical history of coronary artery disease currently on Plavix, history of CVA was seen in the office for evaluation of dysphagia.  Also history of pulmonary fibrosis.  Not able to finish barium swallow as barium got lodged in pyriform fossa.  Continues to have trouble swallowing.  Here for EGD with possible dilation.  Past Medical History:  Diagnosis Date   Aortic atherosclerosis ([place])    GERD (gastroesophageal reflux disease)    Gout    Hypertension    Sleep apnea 2013   Not CPAP   TIA     Past Surgical History:  Procedure Laterality Date   COLONOSCOPY WITH PROPOFOL N/A 10/10/2014   Procedure: COLONOSCOPY WITH PROPOFOL;  Surgeon: Garlan Fair, MD;  Location: [facility];  Service: Endoscopy;  Laterality: N/A;   HERNIA REPAIR Bilateral 1984 and 1998    Prior to Admission medications   Medication Sig Start Date End Date Taking? Authorizing Provider  allopurinol (ZYLOPRIM) 300 MG tablet Take 300 mg by mouth daily.   Yes [provider]  amLODipine (NORVASC) 5 MG tablet Take 5 mg by mouth daily.   Yes [provider]  atenolol (TENORMIN) 50 MG tablet Take 50 mg by mouth daily.   Yes [provider]  cetirizine (ZYRTEC) 10 MG tablet Take 10 mg by mouth daily as needed for allergies.   Yes [provider]  clopidogrel (PLAVIX) 75 MG tablet Take 75 mg by mouth daily.   Yes [provider]  hydrochlorothiazide (MICROZIDE) 12.5 MG capsule Take 12.5 mg by mouth daily.   Yes [provider]  levothyroxine (SYNTHROID) 25 MCG tablet Take 25 mcg by mouth daily before breakfast.   Yes [provider]  Nintedanib (OFEV) 150 MG CAPS Take 1 capsule (150 mg total) by mouth 2 (two) times daily. 11/23/21  Yes Freddi Starr, MD  pantoprazole (PROTONIX) 40 MG  tablet Take 40 mg by mouth daily. 02/01/22  Yes [provider]  sertraline (ZOLOFT) 50 MG tablet Take 50 mg by mouth daily.   Yes [provider]    Scheduled Meds: Continuous Infusions:  sodium chloride     lactated ringers 1,000 mL (04/16/22 0948)   PRN Meds:.lactated ringers  Allergies as of 02/01/2022   (No Known Allergies)    Family History  Problem Relation Age of Onset   Alzheimer's disease Mother    Heart attack Brother 43   Stroke Neg Hx     Social History   Socioeconomic History   Marital status: Married    Spouse name: Not on file   Number of children: Not on file   Years of education: Not on file   Highest education level: Not on file  Occupational History   Not on file  Tobacco Use   Smoking status: Never   Smokeless tobacco: Never  Vaping Use   Vaping Use: Never used  Substance and Sexual Activity   Alcohol use: Yes    Comment: occasionally beer on weekends   Drug use: No   Sexual activity: Not on file  Other Topics Concern   Not on file  Social History Narrative   Lives with wife.  4 children over two marriages.  Retired Optometrist   Social Determinants of Radio broadcast assistant Strain: Not on [employer] Insecurity: Not  on file  Transportation Needs: Not on file  Physical Activity: Not on file  Stress: Not on file  Social Connections: Not on file  Intimate Partner Violence: Not on file    Review of Systems: All negative except as stated above in HPI.  Physical Exam: Vital signs: Vitals:   04/16/22 0938  BP: (!) 185/68  Pulse: 66  Resp: 20  Temp: 98.3 F (36.8 C)  SpO2: 100%     General:   Alert,  Well-developed, well-nourished, pleasant and cooperative in NAD Lungs: No visible respiratory distress, anterior exam only Heart:  Regular rate and rhythm; no murmurs, clicks, rubs,  or gallops. Abdomen: Soft, nontender, nondistended, bowel sounds present, no peritoneal signs Rectal:  Deferred  GI:  Lab  Results: No results for input(s): "WBC", "HGB", "HCT", "PLT" in the last 72 hours. BMET No results for input(s): "NA", "K", "CL", "CO2", "GLUCOSE", "BUN", "CREATININE", "CALCIUM" in the last 72 hours. LFT No results for input(s): "PROT", "ALBUMIN", "AST", "ALT", "ALKPHOS", "BILITOT", "BILIDIR", "IBILI" in the last 72 hours. PT/INR No results for input(s): "LABPROT", "INR" in the last 72 hours.   Studies/Results: No results found.  Impression/Plan: -Pharyngoesophageal dysphagia -History of coronary artery disease and CVA.  Plavix on hold since July 27 -History of pulmonary fibrosis  Recommendations ------------------------- -Proceed with EGD with possible dilation.  Risks (bleeding, infection, bowel perforation that could require surgery, sedation-related changes in cardiopulmonary systems), benefits (identification and possible treatment of source of symptoms, exclusion of certain causes of symptoms), and alternatives (watchful waiting, radiographic imaging studies, empiric medical treatment)  were explained to patient/family in detail and patient wishes to proceed.     LOS: 0 days   Otis Brace  MD, FACP 04/16/2022, 9:49 AM  Contact #  [phone]

## 2022-04-16 NOTE — Transfer of Care (Signed)
Immediate Anesthesia Transfer of Care Note  Patient: Thomas Bond  Procedure(s) Performed: ESOPHAGOGASTRODUODENOSCOPY (EGD) WITH PROPOFOL SAVORY DILATION HEMOSTASIS CLIP PLACEMENT POLYPECTOMY BIOPSY  Patient Location: Endoscopy Unit  Anesthesia Type:MAC  Level of Consciousness: drowsy  Airway & Oxygen Therapy: Patient Spontanous Breathing  Post-op Assessment: Report given to RN and Post -op Vital signs reviewed and stable  Post vital signs: Reviewed and stable--CRNA gave Phenylephrine bolus in PACU with repeat BP reading of 132/63 at 1114.  Last Vitals:  Vitals Value Taken Time  BP 73/45 04/16/22 1108  Temp    Pulse 57 04/16/22 1108  Resp 17 04/16/22 1108  SpO2 97 % 04/16/22 1108    Last Pain:  Vitals:   04/16/22 1108  TempSrc:   PainSc: Asleep         Complications: No notable events documented.

## 2022-04-16 NOTE — Anesthesia Postprocedure Evaluation (Signed)
Anesthesia Post Note  Patient: Thomas Bond  Procedure(s) Performed: ESOPHAGOGASTRODUODENOSCOPY (EGD) WITH PROPOFOL SAVORY DILATION HEMOSTASIS CLIP PLACEMENT POLYPECTOMY BIOPSY     Patient location during evaluation: Endoscopy Anesthesia Type: MAC Level of consciousness: awake and alert Pain management: pain level controlled Vital Signs Assessment: post-procedure vital signs reviewed and stable Respiratory status: spontaneous breathing, nonlabored ventilation, respiratory function stable and patient connected to nasal cannula oxygen Cardiovascular status: blood pressure returned to baseline and stable Postop Assessment: no apparent nausea or vomiting Anesthetic complications: no   No notable events documented.  Last Vitals:  Vitals:   04/16/22 1133 04/16/22 1143  BP: 139/60 (!) 141/54  Pulse: (!) 51 (!) 50  Resp: 17 20  Temp:    SpO2: 100% 100%    Last Pain:  Vitals:   04/16/22 1143  TempSrc:   PainSc: 0-No pain                 Barnet Glasgow

## 2022-04-17 LAB — SURGICAL PATHOLOGY

## 2022-05-15 ENCOUNTER — Encounter: Payer: Self-pay | Admitting: Pulmonary Disease

## 2022-05-15 ENCOUNTER — Ambulatory Visit: Payer: Medicare HMO | Admitting: Pulmonary Disease

## 2022-05-15 VITALS — BP 110/64 | HR 102 | Ht 64.0 in | Wt 149.8 lb

## 2022-05-15 DIAGNOSIS — J8410 Pulmonary fibrosis, unspecified: Secondary | ICD-10-CM

## 2022-05-15 NOTE — Patient Instructions (Addendum)
Stop Ofev medication  We will place a referral to pulmonary rehab  We will check a CT Chest scan in December  Follow up in January with pulmonary function tests

## 2022-05-15 NOTE — Progress Notes (Unsigned)
Synopsis: Referred in December 2022 for abnormal chest x-ray by Holland Commons, FNP  Subjective:   PATIENT ID: Thomas Bond GENDER: male DOB: 1941/08/26, MRN: [id number]  HPI  Chief Complaint  Patient presents with   Follow-up    Pt f/u for pulmonary fibrosis, pt still having SOB/coughing.    Thomas Bond is an 81 year old male, never smoker with GERD, hypertension, dysphagia and history of stroke who returns to pulmonary clinic for pulmonary fibrosis.   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 Evidence of gastritis s/p biopsies.  He is taking ofev 150 1 tab daily. He cut back from twice daily. He continues to have queasy stomach, He has diarrhea. He goes back to bed one to two times per day.    OV 02/12/22 He was seen by ENT on 01/16/22, note reviewed. No significant findings on flexible laryngoscopy to explain his hoarseness of voice.   He saw Speech Therapy 01/01/22 who recommended updated MBS and ongoing therapy for pharyngeal strengthening.   He started on ofev 176m 1 tab twice daily since last visit. He has lost 8lbs since last visit. He has lost 14lbs since the beginning of the year. Since starting ofev he has decreased appetite and is having loose stools 2-3 times per day.   OV 12/21/21 He met with our pharmacy team on 10/17/21 and has not received Esbriet yet.  He had modified barium swallow as well as esophagram performed in January 2023 where he was noted to have the barium tablet lodged in the left piriform sinus.  He continues to have hoarse voice and trouble swallowing.  He is scheduled to see ENT in May.  Pulmonary function test 10/12/2021 showed mixed obstructive and restrictive defects along with significant bronchodilator response and moderate diffusion defect.  I reviewed his HRCT chest scan findings and pulmonary function test findings with the patient and his wife.  We had detailed discussion  about the progression of pulmonary fibrosis and thinking about their advanced directives.  OV 10/12/21 HRCT Chest 08/24/21 shows evidence of usual interstitial pneumonia with widespread areas of ground glass atenuation, septal thickening, subpleural reticulation, traction bronchiectasis and peripheral bronchiolectasis with very mild honeycombing in the lung bases.   PFTs today show mild restriction with moderate diffusion defect.   He did not notice benefit [facility].   He continues to have hoarseness of voice which is new over recent months. MBS from 2019 noted from office visit 08/17/21 with anterior cervical spine intrusion of pharynx/esophagus.  OV 08/17/21 Patient reports having shortness of breath over the past couple of years.  He has been tried on Asmanex as needed which he did not notice much improvement in his symptoms.  He has issues with dysphagia and has been evaluated by speech therapy multiple times over the past years.  Modified barium swallow from 2019 which shows mild pharyngeal cervical esophageal dysphagia due to mechanical obstruction from anterior cervical spine.  No aspiration observed and trace laryngeal penetration of thin liquids from vallecular residuals spilling into open airway cleared with further swallow.  Epiglottic deflection decreased likely due to cervical spine protrusion into pharynx which contributes to pharyngeal residuals.  He denies any issues with frequent pneumonias over the past years.  He denies any cough but does report wheezing.  He does report hoarseness in his voice which has been more constant over recent months.  He denies any seasonal  allergies.  He denies any sinus congestion or postnasal drainage.  He is a never smoker but is exposed to significant secondhand smoke growing up as his parents smoked in the household.  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  He complains  that his feet feel swollen in the morning time but when he checks them they do not appear swollen.  Past Medical History:  Diagnosis Date   Aortic atherosclerosis (HCC)    GERD (gastroesophageal reflux disease)    Gout    Hypertension    Sleep apnea 2013   Not CPAP   TIA      Family History  Problem Relation Age of Onset   Alzheimer's disease Mother    Heart attack Brother 32   Stroke Neg Hx      Social History   Socioeconomic History   Marital status: Married    Spouse name: Not on file   Number of children: Not on file   Years of education: Not on file   Highest education level: Not on file  Occupational History   Not on file  Tobacco Use   Smoking status: Never   Smokeless tobacco: Never  Vaping Use   Vaping Use: Never used  Substance and Sexual Activity   Alcohol use: Yes    Comment: occasionally beer on weekends   Drug use: No   Sexual activity: Not on file  Other Topics Concern   Not on file  Social History Narrative   Lives with wife.  4 children over two marriages.  Retired Psychiatric nurse of Radio broadcast assistant Strain: Not on [employer] Insecurity: Not on file  Transportation Needs: Not on file  Physical Activity: Not on file  Stress: Not on file  Social Connections: Not on file  Intimate Partner Violence: Not on file     No Known Allergies   Outpatient Medications Prior to Visit  Medication Sig Dispense Refill   allopurinol (ZYLOPRIM) 300 MG tablet Take 300 mg by mouth daily.     amLODipine (NORVASC) 5 MG tablet Take 5 mg by mouth daily.     atenolol (TENORMIN) 50 MG tablet Take 50 mg by mouth daily.     cetirizine (ZYRTEC) 10 MG tablet Take 10 mg by mouth daily as needed for allergies.     clopidogrel (PLAVIX) 75 MG tablet Take 75 mg by mouth daily.     hydrochlorothiazide (MICROZIDE) 12.5 MG capsule Take 12.5 mg by mouth daily.     levothyroxine (SYNTHROID) 25 MCG tablet Take 25 mcg by mouth daily before breakfast.      Nintedanib (OFEV) 150 MG CAPS Take 1 capsule (150 mg total) by mouth 2 (two) times daily. 180 capsule 1   pantoprazole (PROTONIX) 40 MG tablet Take 1 tablet (40 mg total) by mouth 2 (two) times daily. 60 tablet 1   sertraline (ZOLOFT) 50 MG tablet Take 50 mg by mouth daily.     No facility-administered medications prior to visit.   Review of Systems  Constitutional:  Negative for chills, fever, malaise/fatigue and weight loss.  HENT:  Negative for congestion, sinus pain and sore throat.        Difficulty swallowing  Eyes: Negative.   Respiratory:  Positive for cough and shortness of breath. Negative for hemoptysis, sputum production and wheezing.   Cardiovascular:  Negative for chest pain, palpitations, orthopnea, claudication and leg swelling.  Gastrointestinal:  Negative for abdominal pain, heartburn, nausea and vomiting.  Genitourinary: Negative.   Musculoskeletal:  Negative for joint pain and myalgias.  Skin:  Negative for rash.  Neurological:  Negative for weakness.  Endo/Heme/Allergies: Negative.   Psychiatric/Behavioral: Negative.      Objective:   Vitals:   05/15/22 1338  BP: 110/64  Pulse: (!) 102  SpO2: 99%  Weight: 149 lb 12.8 oz (67.9 kg)  Height: $Remove'5\' 4"'Birosiq$  (1.626 m)    Physical Exam Constitutional:      General: He is not in acute distress. HENT:     Head: Normocephalic and atraumatic.  Eyes:     Conjunctiva/sclera: Conjunctivae normal.  Cardiovascular:     Rate and Rhythm: Normal rate and regular rhythm.     Pulses: Normal pulses.     Heart sounds: Normal heart sounds. No murmur heard. Pulmonary:     Breath sounds: Rales (Bibasilar) present.  Musculoskeletal:     Right lower leg: No edema.     Left lower leg: No edema.  Skin:    General: Skin is warm and dry.  Neurological:     General: No focal deficit present.     Mental Status: He is alert.  Psychiatric:        Mood and Affect: Mood normal.        Behavior: Behavior normal.        Thought  Content: Thought content normal.        Judgment: Judgment normal.    CBC    Component Value Date/Time   WBC 10.0 05/22/2016 1829   RBC 4.61 05/22/2016 1829   HGB 16.3 05/22/2016 1838   HCT 48.0 05/22/2016 1838   PLT 255 05/22/2016 1829   MCV 94.8 05/22/2016 1829   MCH 32.3 05/22/2016 1829   MCHC 34.1 05/22/2016 1829   RDW 14.1 05/22/2016 1829   LYMPHSABS 3.8 05/22/2016 1829   MONOABS 0.9 05/22/2016 1829   EOSABS 0.6 05/22/2016 1829   BASOSABS 0.1 05/22/2016 1829      Latest Ref Rng & Units 05/22/2016    6:38 PM 05/22/2016    6:29 PM  BMP  Glucose 65 - 99 mg/dL 94  95   BUN 6 - 20 mg/dL 14  12   Creatinine 0.61 - 1.24 mg/dL 1.20  1.28   Sodium 135 - 145 mmol/L 140  137   Potassium 3.5 - 5.1 mmol/L 3.5  3.4   Chloride 101 - 111 mmol/L 100  100   CO2 22 - 32 mmol/L  25   Calcium 8.9 - 10.3 mg/dL  9.5    Chest imaging: HRCT Chest 08/24/21 1. The appearance of the lungs is considered diagnostic of usual interstitial pneumonia (UIP) per current ATS guidelines. 2. Aortic atherosclerosis, in addition to left main and 3 vessel coronary artery disease. 3. There are calcifications of the aortic valve. Echocardiographic correlation for evaluation of potential valvular dysfunction may be warranted if clinically indicated. 4. Morphologic changes in the liver, suggestive of underlying cirrhosis.  PFT:    Latest Ref Rng & Units 10/12/2021   12:51 PM  PFT Results  FVC-Pre L 2.35   FVC-Predicted Pre % 73   FVC-Post L 2.71   FVC-Predicted Post % 84   Pre FEV1/FVC % % 47   Post FEV1/FCV % % 73   FEV1-Pre L 1.11   FEV1-Predicted Pre % 49   FEV1-Post L 1.97   DLCO uncorrected ml/min/mmHg 11.35   DLCO UNC% % 54   DLCO corrected  ml/min/mmHg 11.35   DLCO COR %Predicted % 54   DLVA Predicted % 69   TLC L 4.38   TLC % Predicted % 71   RV % Predicted % 80   2023: Mild restriction, moderate diffusion defect  Labs:  Path:  Echo:  Heart Catheterization:  Assessment & Plan:    No diagnosis found.  Discussion: Thomas Bond is an 81 year old male, never smoker with GERD, hypertension, dysphagia and history of stroke who returns to pulmonary clinic for pulmonary fibrosis.   Patient's high resolution CT Chest is diagnostic of pulmonary fibrosis with usual interstitial pattern. He has been started on Ofev 12/2021 and has tolerated it well so far. He does have on going weight loss since the beginning of the year. He has lack of appetite since starting ofev and loose stools. We will continue therapy for now but we discussed if he has further issues with weight loss we will need to discontinue the medication. I have encouraged him to take protein drinks, at least 2 per day to help supplement his nutrition.   On simple walk today he did not desaturate below 88%.   He is to follow up with speech for MBS and short course of therapy to work on pharyngeal strengthening exercises.   Follow-up in 3 months.  Freda Jackson, MD [facility] Office: [phone]   Current Outpatient Medications:    allopurinol (ZYLOPRIM) 300 MG tablet, Take 300 mg by mouth daily., Disp: , Rfl:    amLODipine (NORVASC) 5 MG tablet, Take 5 mg by mouth daily., Disp: , Rfl:    atenolol (TENORMIN) 50 MG tablet, Take 50 mg by mouth daily., Disp: , Rfl:    cetirizine (ZYRTEC) 10 MG tablet, Take 10 mg by mouth daily as needed for allergies., Disp: , Rfl:    clopidogrel (PLAVIX) 75 MG tablet, Take 75 mg by mouth daily., Disp: , Rfl:    hydrochlorothiazide (MICROZIDE) 12.5 MG capsule, Take 12.5 mg by mouth daily., Disp: , Rfl:    levothyroxine (SYNTHROID) 25 MCG tablet, Take 25 mcg by mouth daily before breakfast., Disp: , Rfl:    Nintedanib (OFEV) 150 MG CAPS, Take 1 capsule (150 mg total) by mouth 2 (two) times daily., Disp: 180 capsule, Rfl: 1   pantoprazole (PROTONIX) 40 MG tablet, Take 1 tablet (40 mg total) by mouth 2 (two) times daily., Disp: 60 tablet, Rfl: 1    sertraline (ZOLOFT) 50 MG tablet, Take 50 mg by mouth daily., Disp: , Rfl:

## 2022-05-16 ENCOUNTER — Encounter: Payer: Self-pay | Admitting: Pulmonary Disease

## 2022-05-17 ENCOUNTER — Telehealth: Payer: Self-pay | Admitting: Pharmacist

## 2022-05-17 NOTE — Telephone Encounter (Addendum)
Patient receives medication from [facility]. As long as we do not send in further refills to [facility], patient will stop receiving shipments of medication  Ofev has already been removed from patient's med list.  Knox Saliva, PharmD, MPH, BCPS, CPP Clinical Pharmacist (Rheumatology and Pulmonology)  ----- Message from Freddi Starr, MD sent at 05/16/2022  9:05 PM EDT ----- Regarding: Stopping antifibrotic therapy Hi Thomas Bond,  FYI. I have decided to stop his ofev therapy due to weight loss and side effects leading to poor quality of life. Not sure if the specialty pharmacy needs to be contacted or if simply discontinuing the medication in the [facility] is sufficient.  Thanks, Wille Glaser

## 2022-05-31 ENCOUNTER — Telehealth: Payer: Self-pay | Admitting: Pulmonary Disease

## 2022-05-31 NOTE — Telephone Encounter (Signed)
Lm x1 for patient.  

## 2022-05-31 NOTE — Telephone Encounter (Signed)
Spoke with pt who states wife has paxlovid at home from where she had started taking it and could not tolerate it. Pt said he will just take the rest of that and not to call him anything in. Nothing further needed at this time.

## 2022-05-31 NOTE — Telephone Encounter (Signed)
Recommend he start course of paxlovid, please send in prescription if patient is ok with starting this medication.   Viral infections can lead to flares in his underlying fibrosis so that is why I recommend treatment at this time to limit the extent of the viral infection.  Thanks,  JD

## 2022-05-31 NOTE — Telephone Encounter (Signed)
[phone] pt calling back

## 2022-05-31 NOTE — Telephone Encounter (Signed)
Called and spoke with pt who states he tested positive for covid 3 days ago. States that he has had symptoms including headache, believes he has a temp but hasn't taken temp, cough that is a dry cough, and also has been fatigued.  Pt wants to know what might be able to be recommended due to this. Dr. Tarri Abernethy, please advise.

## 2022-06-22 ENCOUNTER — Encounter (HOSPITAL_BASED_OUTPATIENT_CLINIC_OR_DEPARTMENT_OTHER): Payer: Self-pay

## 2022-06-22 ENCOUNTER — Other Ambulatory Visit: Payer: Self-pay

## 2022-06-22 ENCOUNTER — Emergency Department (HOSPITAL_BASED_OUTPATIENT_CLINIC_OR_DEPARTMENT_OTHER): Payer: Medicare HMO

## 2022-06-22 ENCOUNTER — Emergency Department (HOSPITAL_BASED_OUTPATIENT_CLINIC_OR_DEPARTMENT_OTHER)
Admission: EM | Admit: 2022-06-22 | Discharge: 2022-06-22 | Disposition: A | Discharge status: Home / Self Care | Payer: Medicare HMO | Attending: Emergency Medicine | Admitting: Emergency Medicine

## 2022-06-22 ENCOUNTER — Emergency Department (HOSPITAL_BASED_OUTPATIENT_CLINIC_OR_DEPARTMENT_OTHER): Payer: Medicare HMO | Admitting: Radiology

## 2022-06-22 DIAGNOSIS — R079 Chest pain, unspecified: Secondary | ICD-10-CM | POA: Diagnosis not present

## 2022-06-22 DIAGNOSIS — Z79899 Other long term (current) drug therapy: Secondary | ICD-10-CM | POA: Insufficient documentation

## 2022-06-22 DIAGNOSIS — I10 Essential (primary) hypertension: Secondary | ICD-10-CM | POA: Insufficient documentation

## 2022-06-22 DIAGNOSIS — R0789 Other chest pain: Secondary | ICD-10-CM

## 2022-06-22 DIAGNOSIS — Z20822 Contact with and (suspected) exposure to covid-19: Secondary | ICD-10-CM | POA: Insufficient documentation

## 2022-06-22 HISTORY — DX: Pulmonary fibrosis, unspecified: J84.10

## 2022-06-22 LAB — CBC
HCT: 36.5 % — ABNORMAL LOW (ref 39.0–52.0)
Hemoglobin: 12.4 g/dL — ABNORMAL LOW (ref 13.0–17.0)
MCH: 31.2 pg (ref 26.0–34.0)
MCHC: 34 g/dL (ref 30.0–36.0)
MCV: 91.7 fL (ref 80.0–100.0)
Platelets: 223 10*3/uL (ref 150–400)
RBC: 3.98 MIL/uL — ABNORMAL LOW (ref 4.22–5.81)
RDW: 13.1 % (ref 11.5–15.5)
WBC: 10.7 10*3/uL — ABNORMAL HIGH (ref 4.0–10.5)
nRBC: 0 % (ref 0.0–0.2)

## 2022-06-22 LAB — BASIC METABOLIC PANEL
Anion gap: 14 (ref 5–15)
BUN: 20 mg/dL (ref 8–23)
CO2: 28 mmol/L (ref 22–32)
Calcium: 9.3 mg/dL (ref 8.9–10.3)
Chloride: 96 mmol/L — ABNORMAL LOW (ref 98–111)
Creatinine, Ser: 1.26 mg/dL — ABNORMAL HIGH (ref 0.61–1.24)
GFR, Estimated: 57 mL/min — ABNORMAL LOW (ref >60)
Glucose, Bld: 93 mg/dL (ref 70–99)
Potassium: 3.4 mmol/L — ABNORMAL LOW (ref 3.5–5.1)
Sodium: 138 mmol/L (ref 135–145)

## 2022-06-22 LAB — RESP PANEL BY RT-PCR (FLU A&B, COVID) ARPGX2
Influenza A by PCR: NEGATIVE
Influenza B by PCR: NEGATIVE
SARS Coronavirus 2 by RT PCR: NEGATIVE

## 2022-06-22 LAB — TROPONIN I (HIGH SENSITIVITY): Troponin I (High Sensitivity): 9 ng/L (ref <18)

## 2022-06-22 MED ORDER — IOHEXOL 350 MG/ML SOLN
100.0000 mL | Freq: Once | INTRAVENOUS | Status: AC | PRN
  Administered 2022-06-22: 66 mL via INTRAVENOUS

## 2022-06-22 NOTE — ED Notes (Signed)
Pt verbalizes understanding of discharge instructions. Opportunity for questioning and answers were provided. Pt discharged from ED to home with family.    

## 2022-06-22 NOTE — ED Triage Notes (Addendum)
Pt arrives POV from home with right side chest pain that began this morning.  Pt denies any nausea/vomiting, shortness of breath, feeling diaphoretic, denies fevers or chills.   Pt states the pain feels like a pulled muscle and it only hurts when he moves.   Pt ambulatory to triage, in NAD.

## 2022-06-22 NOTE — ED Provider Notes (Signed)
[facility] EMERGENCY DEPT Provider Note   CSN: [id number] Arrival date & time: 06/22/22  1430     History  Chief Complaint  Patient presents with   Chest Pain    Thomas Bond is a 81 y.o. male.  Pt is a 81 yo male with a pmhx significant for HTN, GERD, sleep apnea, and pulmonary fibrosis.  Pt's wife contracted Covid about 2 weeks ago.  She developed severe pneumonia and is hospitalized at [facility] and is not doing well.  Pt felt like he had the same virus when she did and thought he recovered.  He developed right sided cp this am.  Pt said it hurts when he moves.  He is worried he has developed pna.  No fevers.  No sob.       Home Medications Prior to Admission medications   Medication Sig Start Date End Date Taking? Authorizing Provider  allopurinol (ZYLOPRIM) 300 MG tablet Take 300 mg by mouth daily.    [provider]  amLODipine (NORVASC) 5 MG tablet Take 5 mg by mouth daily.    [provider]  atenolol (TENORMIN) 50 MG tablet Take 50 mg by mouth daily.    [provider]  cetirizine (ZYRTEC) 10 MG tablet Take 10 mg by mouth daily as needed for allergies.    [provider]  clopidogrel (PLAVIX) 75 MG tablet Take 75 mg by mouth daily.    [provider]  hydrochlorothiazide (MICROZIDE) 12.5 MG capsule Take 12.5 mg by mouth daily.    [provider]  levothyroxine (SYNTHROID) 25 MCG tablet Take 25 mcg by mouth daily before breakfast.    [provider]  pantoprazole (PROTONIX) 40 MG tablet Take 1 tablet (40 mg total) by mouth 2 (two) times daily. 04/16/22 05/16/22  Otis Brace, MD  sertraline (ZOLOFT) 50 MG tablet Take 50 mg by mouth daily.    [provider]      Allergies    Patient has no known allergies.    Review of Systems   Review of Systems  Cardiovascular:  Positive for chest pain.  All other systems reviewed and are negative.   Physical Exam Updated Vital Signs BP  138/75 (BP Location: Right Arm)   Pulse (!) 57   Temp 97.8 F (36.6 C) (Oral)   Resp 19   Ht '5\' 6"'$  (1.676 m)   Wt 63.5 kg   SpO2 99%   BMI 22.60 kg/m  Physical Exam Vitals and nursing note reviewed.  Constitutional:      Appearance: He is well-developed.  HENT:     Head: Normocephalic and atraumatic.  Eyes:     Extraocular Movements: Extraocular movements intact.     Pupils: Pupils are equal, round, and reactive to light.  Cardiovascular:     Rate and Rhythm: Normal rate and regular rhythm.     Heart sounds: Normal heart sounds.  Pulmonary:     Effort: Pulmonary effort is normal.     Breath sounds: Normal breath sounds.  Chest:    Abdominal:     General: Bowel sounds are normal.     Palpations: Abdomen is soft.  Musculoskeletal:        General: Normal range of motion.     Cervical back: Normal range of motion and neck supple.  Skin:    General: Skin is warm.     Capillary Refill: Capillary refill takes less than 2 seconds.  Neurological:     General: No focal deficit  present.     Mental Status: He is alert and oriented to person, place, and time.  Psychiatric:        Mood and Affect: Mood normal.        Behavior: Behavior normal.     ED Results / Procedures / Treatments   Labs (all labs ordered are listed, but only abnormal results are displayed) Labs Reviewed  BASIC METABOLIC PANEL - Abnormal; Notable for the following components:      Result Value   Potassium 3.4 (*)    Chloride 96 (*)    Creatinine, Ser 1.26 (*)    GFR, Estimated 57 (*)    All other components within normal limits  CBC - Abnormal; Notable for the following components:   WBC 10.7 (*)    RBC 3.98 (*)    Hemoglobin 12.4 (*)    HCT 36.5 (*)    All other components within normal limits  RESP PANEL BY RT-PCR (FLU A&B, COVID) ARPGX2  TROPONIN I (HIGH SENSITIVITY)  TROPONIN I (HIGH SENSITIVITY)    EKG EKG Interpretation  Date/Time:  Saturday June 22 2022 14:42:34 EDT Ventricular  Rate:  65 PR Interval:  156 QRS Duration: 92 QT Interval:  422 QTC Calculation: 438 R Axis:   -57 Text Interpretation: Sinus rhythm with Premature atrial complexes Left anterior fascicular block Moderate voltage criteria for LVH, may be normal variant ( R in aVL , Cornell product ) Abnormal ECG When compared with ECG of 22-May-2016 18:09, No significant change was found No significant change since last tracing Confirmed by Isla Pence [id number]) on 06/22/2022 5:43:24 PM  Radiology CT Angio Chest PE W and/or Wo Contrast  Result Date: 06/22/2022 CLINICAL DATA:  Right chest pain, high clinical suspicion for PE EXAM: CT ANGIOGRAPHY CHEST WITH CONTRAST TECHNIQUE: Multidetector CT imaging of the chest was performed using the standard protocol during bolus administration of intravenous contrast. Multiplanar CT image reconstructions and MIPs were obtained to evaluate the vascular anatomy. RADIATION DOSE REDUCTION: This exam was performed according to the departmental dose-optimization program which includes automated exposure control, adjustment of the mA and/or kV according to patient size and/or use of iterative reconstruction technique. CONTRAST:  48m OMNIPAQUE IOHEXOL 350 MG/ML SOLN COMPARISON:  08/24/2021 FINDINGS: Cardiovascular: There are no intraluminal filling defects in pulmonary artery branches. Contrast density in thoracic aorta is less than adequate to evaluate the lumen. Calcifications are seen in aortic annulus. There is ectasia of ascending thoracic aorta measuring 3.9 cm. Coronary artery calcifications are seen. Heart is enlarged in size. Mediastinum/Nodes: Subcentimeter nodes are seen in mediastinum. Thyroid is not distinctly visualized. Lungs/Pleura: Increased interstitial markings are seen with subpleural honeycombing in both lungs suggesting chronic interstitial lung disease, possibly UIP. There is no focal pulmonary consolidation. There is no pleural effusion or pneumothorax. Upper Abdomen:  There is 12 cm cyst in the upper pole of left kidney. Musculoskeletal: Degenerative changes are noted with encroachment of neural foramina at C6-C7 level in cervical spine. Review of the MIP images confirms the above findings. IMPRESSION: There is no evidence of pulmonary artery embolism. Coronary artery disease. There is ectasia of ascending thoracic aorta measuring 3.9 cm. Recommend annual imaging followup by CTA or MRA. This recommendation follows 2010 ACCF/AHA/AATS/ACR/ASA/SCA/SCAI/SIR/STS/SVM Guidelines for the Diagnosis and Management of Patients with Thoracic Aortic Disease. Circulation. 2010; 121:: G867-Y195 Aortic aneurysm NOS (ICD10-I71.9) Chronic interstitial lung disease, possibly UIP. There is no focal pulmonary consolidation. Large left renal cyst. Other findings as described in the body of the report.  Electronically Signed   By: Elmer Picker M.D.   On: 06/22/2022 18:55   DG Chest 2 View  Result Date: 06/22/2022 CLINICAL DATA:  Chest pain EXAM: CHEST - 2 VIEW COMPARISON:  Previous studies including chest radiograph done on 08/17/2013 and CT done on 08/24/2021 FINDINGS: Cardiac size is within normal limits. Increased interstitial markings are seen in the periphery of both lungs, more so in the lower lung fields. There is interval progression of this finding in comparison with the chest radiograph done on 08/17/2013. Peripheral fibrotic changes have not changed significantly in comparison with the CT done on 08/24/2021. There are 2 linear metallic densities in epigastrium, possibly suggesting previous intervention. There is no pleural effusion or pneumothorax. IMPRESSION: Increased interstitial markings in the periphery of both lungs suggest scarring from chronic interstitial lung disease. There are no signs of pulmonary edema or focal pulmonary consolidation. Electronically Signed   By: Elmer Picker M.D.   On: 06/22/2022 15:05    Procedures Procedures    Medications Ordered in  ED Medications  iohexol (OMNIPAQUE) 350 MG/ML injection 100 mL (66 mLs Intravenous Contrast Given 06/22/22 1829)    ED Course/ Medical Decision Making/ A&P                           Medical Decision Making Amount and/or Complexity of Data Reviewed Labs: ordered. Radiology: ordered.  Risk Prescription drug management.   This patient presents to the ED for concern of right sided cp, this involves an extensive number of treatment options, and is a complaint that carries with it a high risk of complications and morbidity.  The differential diagnosis includes CAD, PE, PNA, Covid/flu   Co morbidities that complicate the patient evaluation  HTN, GERD, sleep apnea, and pulmonary fibrosis   Additional history obtained:  Additional history obtained from epic chart review   Lab Tests:  I Ordered, and personally interpreted labs.  The pertinent results include:  cbc nl, bmp nl except cr elevated at 1.26 (chronic); covid/flu neg; trop nl   Imaging Studies ordered:  I ordered imaging studies including ct chest  I independently visualized and interpreted imaging which showed  IMPRESSION:  There is no evidence of pulmonary artery embolism. Coronary artery  disease.    There is ectasia of ascending thoracic aorta measuring 3.9 cm.  Recommend annual imaging followup by CTA or MRA. This recommendation  follows 2010 ACCF/AHA/AATS/ACR/ASA/SCA/SCAI/SIR/STS/SVM Guidelines  for the Diagnosis and Management of Patients with Thoracic Aortic  Disease. Circulation. 2010; 121: V035-K093. Aortic aneurysm NOS  (ICD10-I71.9)    Chronic interstitial lung disease, possibly UIP. There is no focal  pulmonary consolidation.    Large left renal cyst. Other findings as described in the body of  the report.   I agree with the radiologist interpretation   Cardiac Monitoring:  The patient was maintained on a cardiac monitor.  I personally viewed and interpreted the cardiac monitored which showed an  underlying rhythm of: nsr   Medicines ordered and prescription drug management:   I have reviewed the patients home medicines and have made adjustments as needed   Test Considered:  ct   Problem List / ED Course:  Cp:  likely msk in nature.  Sx atypical for CAD.  CT neg for PE.  Covid/flu neg.  Pt did not want anything for pain.  I think he just wanted to make sure he was not going to end up like his wife who is  hospitalized with serious pna.  Pt is stable for d/c.  He is to return if worse.  F/u with pcp.   Reevaluation:  After the interventions noted above, I reevaluated the patient and found that they have :improved   Social Determinants of Health:  Lives at home   Dispostion:  After consideration of the diagnostic results and the patients response to treatment, I feel that the patent would benefit from discharge with outpatient f/u.          Final Clinical Impression(s) / ED Diagnoses Final diagnoses:  Atypical chest pain    Rx / DC Orders ED Discharge Orders     None         Isla Pence, MD 06/22/22 2017

## 2022-06-27 ENCOUNTER — Telehealth: Payer: Self-pay

## 2022-06-27 NOTE — Telephone Encounter (Signed)
     Reason for call: ED-Follow up call    Patient  visited [facility] on 06/22/2022  for Chest Pain   Telephone encounter attempt :  1st Attempt  A HIPAA compliant voice message was left requesting a return call.  Instructed patient to call back at [phone] at their earliest convenience.  [facility] management  [place], [place]  Main Phone: [phone]  E-mail: [email or web address]  Website: [email or web address]

## 2022-07-10 DIAGNOSIS — R42 Dizziness and giddiness: Secondary | ICD-10-CM | POA: Diagnosis not present

## 2022-07-12 ENCOUNTER — Telehealth: Payer: Self-pay

## 2022-07-12 NOTE — Telephone Encounter (Signed)
    Reason for call: ED-Follow up call       Patient  visited [facility] on 06/22/2022  for Chest Pain     Telephone encounter attempt :  2nd Attempt  A HIPAA compliant voice message was left requesting a return call.  Instructed patient to call back at [phone] at their earliest convenience.  [facility] management  [place], [place]  Main Phone: [phone]  E-mail: [email or web address]  Website: [email or web address]

## 2022-07-18 ENCOUNTER — Encounter: Payer: Self-pay | Admitting: Pulmonary Disease

## 2022-07-18 ENCOUNTER — Ambulatory Visit: Payer: Medicare HMO | Admitting: Pulmonary Disease

## 2022-07-18 ENCOUNTER — Telehealth: Payer: Self-pay | Admitting: Pulmonary Disease

## 2022-07-18 VITALS — BP 110/60 | HR 62 | Temp 98.0°F | Ht 66.0 in | Wt 140.8 lb

## 2022-07-18 DIAGNOSIS — R051 Acute cough: Secondary | ICD-10-CM

## 2022-07-18 DIAGNOSIS — J8410 Pulmonary fibrosis, unspecified: Secondary | ICD-10-CM | POA: Diagnosis not present

## 2022-07-18 DIAGNOSIS — R0602 Shortness of breath: Secondary | ICD-10-CM | POA: Diagnosis not present

## 2022-07-18 MED ORDER — AZITHROMYCIN 250 MG PO TABS: 250.0000 mg | ORAL_TABLET | Freq: Every day | ORAL | 0 refills | Status: DC

## 2022-07-18 MED ORDER — PREDNISONE 20 MG PO TABS: 40.0000 mg | ORAL_TABLET | Freq: Every day | ORAL | 0 refills | Status: DC

## 2022-07-18 NOTE — Patient Instructions (Signed)
Prescription for prednisone and a course of antibiotics will be sent to pharmacy for you  Keep your next appointment with Dr. Erin Fulling  We will be glad to see you sooner if there is a need

## 2022-07-18 NOTE — Progress Notes (Signed)
Thomas Bond    [id number]    1941-02-17  Primary Care Physician:Prevost, Leonia Reader, FNP  Referring Physician: Holland Commons, [address] [place],  [place] [address]  Chief complaint:   Patient stated he woke up this morning feeling more short of breath, some cough, hoarseness of his voice Seen as an acute visit today  HPI:  May have felt something starting yesterday so went to bed early  History of pulmonary fibrosis, not able to tolerate Ofev -Stomach upset and weight loss despite decreasing dose to just once a day -Medication was finally discontinued secondary to intolerance  Recently had COVID infection middle of September Did not have a whole lot of symptoms that persisted  Was evaluated at [facility] 06/22/2022 for chest discomfort-CT scan of the chest did not reveal any new findings, no pulmonary embolism  Has had multiple barium swallows Was seen by ENT  Never smoker, exposed to secondhand smoke growing up 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   amLODipine (NORVASC) 5 MG tablet Take 5 mg by mouth daily.   atenolol (TENORMIN) 50 MG tablet Take 50 mg by mouth daily.   cetirizine (ZYRTEC) 10 MG tablet Take 10 mg by mouth daily as needed for allergies.   clopidogrel (PLAVIX) 75 MG tablet Take 75 mg by mouth daily.   hydrochlorothiazide (MICROZIDE) 12.5 MG capsule Take 12.5 mg by mouth daily.   levothyroxine (SYNTHROID) 25 MCG tablet Take 25 mcg by mouth daily before breakfast.   pantoprazole (PROTONIX) 40 MG tablet Take 1 tablet (40 mg total) by mouth 2 (two) times daily.   sertraline (ZOLOFT) 50 MG tablet Take 50 mg by mouth daily.   No facility-administered encounter medications on file as of 07/18/2022.    Allergies as of 07/18/2022   (No Known Allergies)    Past Medical History:   Diagnosis Date   Aortic atherosclerosis (HCC)    GERD (gastroesophageal reflux disease)    Gout    Hypertension    Pulmonary fibrosis ([place])    Sleep apnea 2013   Not CPAP   TIA     Past Surgical History:  Procedure Laterality Date   BIOPSY  04/16/2022   Procedure: BIOPSY;  Surgeon: Otis Brace, MD;  Location: [facility];  Service: Gastroenterology;;   COLONOSCOPY WITH PROPOFOL N/A 10/10/2014   Procedure: COLONOSCOPY WITH PROPOFOL;  Surgeon: Garlan Fair, MD;  Location: [facility];  Service: Endoscopy;  Laterality: N/A;   ESOPHAGOGASTRODUODENOSCOPY (EGD) WITH PROPOFOL N/A 04/16/2022   Procedure: ESOPHAGOGASTRODUODENOSCOPY (EGD) WITH PROPOFOL;  Surgeon: Otis Brace, MD;  Location: [facility];  Service: Gastroenterology;  Laterality: N/A;   HEMOSTASIS CLIP PLACEMENT  04/16/2022   Procedure: HEMOSTASIS CLIP PLACEMENT;  Surgeon: Otis Brace, MD;  Location: [facility];  Service: Gastroenterology;;   HERNIA REPAIR Bilateral 1984 and 1998   POLYPECTOMY  04/16/2022   Procedure: POLYPECTOMY;  Surgeon: Otis Brace, MD;  Location: [facility];  Service: Gastroenterology;;   Azzie Almas DILATION N/A 04/16/2022   Procedure: SAVORY DILATION;  Surgeon: Otis Brace, MD;  Location: [facility];  Service: Gastroenterology;  Laterality: N/A;    Family History  Problem Relation Age of Onset   Alzheimer's disease Mother    Heart attack Brother 54   Stroke Neg Hx     Social History  Socioeconomic History   Marital status: Married    Spouse name: Not on file   Number of children: Not on file   Years of education: Not on file   Highest education level: Not on file  Occupational History   Not on file  Tobacco Use   Smoking status: Never   Smokeless tobacco: Never  Vaping Use   Vaping Use: Never used  Substance and Sexual Activity   Alcohol use: Yes    Comment: occasionally beer on weekends   Drug use: No   Sexual activity: Not on file  Other Topics Concern    Not on file  Social History Narrative   Lives with wife.  4 children over two marriages.  Retired Psychiatric nurse of Radio broadcast assistant Strain: Not on [employer] Insecurity: Not on file  Transportation Needs: Not on file  Physical Activity: Not on file  Stress: Not on file  Social Connections: Not on file  Intimate Partner Violence: Not on file    Review of Systems  HENT:  Positive for voice change.   Respiratory:  Positive for cough and shortness of breath.     Vitals:   07/18/22 1420  BP: 110/60  Pulse: 62  Temp: 98 F (36.7 C)  SpO2: 97%     Physical Exam Constitutional:      Appearance: He is obese.  HENT:     Mouth/Throat:     Mouth: Mucous membranes are moist.  Cardiovascular:     Rate and Rhythm: Normal rate and regular rhythm.     Heart sounds: No murmur heard.    No friction rub.  Pulmonary:     Effort: No respiratory distress.     Breath sounds: No stridor. Rales present. No wheezing or rhonchi.  Neurological:     Mental Status: He is alert.  Psychiatric:        Mood and Affect: Mood normal.      Data Reviewed: Previous CT showing UIP pattern  Assessment:  Bronchitis  Laryngitis  Underlying interstitial lung disease  Plan/Recommendations: Course of steroids  Prescription for azithromycin sent to pharmacy  Encouraged to keep appointment with Dr. Erin Fulling  Encouraged to call with any other significant concerns   [name] MD [facility] 07/18/2022, 2:29 PM  CC: Holland Commons, FNP

## 2022-07-18 NOTE — Telephone Encounter (Signed)
Noted     Closing encounter

## 2022-07-22 ENCOUNTER — Telehealth: Payer: Self-pay | Admitting: Pulmonary Disease

## 2022-07-22 NOTE — Telephone Encounter (Signed)
Primary Pulmonologist: [place] Last office visit and with whom: 07/18/2022 What do we see them for (pulmonary problems): Pulmonary fibrosis, sob, acute cough Last OV assessment/plan:   Assessment:  Bronchitis   Laryngitis   Underlying interstitial lung disease   Plan/Recommendations: Course of steroids   Prescription for azithromycin sent to pharmacy   Encouraged to keep appointment with Dr. Erin Fulling   Encouraged to call with any other significant concerns     [name] MD [facility] 07/18/2022, 2:29 PM   CC: Thomas Commons, FNP          Patient Instructions by [name], MD at 07/18/2022 2:15 PM  Author: [name], MD Author Type: Physician Filed: 07/18/2022  2:38 PM  Note Status: Signed Cosign: Cosign Not Required Encounter Date: 07/18/2022  Editor: [name], MD (Physician)             Prescription for prednisone and a course of antibiotics will be sent to pharmacy for you   Keep your next appointment with Dr. Erin Fulling   We will be glad to see you sooner if there is a need       Instructions  Prescription for prednisone and a course of antibiotics will be sent to pharmacy for you   Keep your next appointment with Dr. Erin Fulling   We will be glad to see you sooner if there is a need       Was appointment offered to patient (explain)?  no   Reason for call: Patient having symptoms of wheezing.  His friend is crushing his meds and putting in apple sauce .  Cannot walk very far without getting sob.  [name] feels he needs oxygen.  No walk document from last visit 07/18/2022.  His friend stats he is hard to understand, but denies any facial droop, weakness of extrmities or vision changes.  Does not have a pulse oximeter to check sats.  His friend is here from [place], but will be leaving in the morning and his sister in law will be with him at that point.  His wife developed covid pna and has been in the hospital  for a month and is getting ready to go to rehab.    Dr. Erin Fulling, please advise.  Thank you.   (examples of things to ask: : When did symptoms start? Fever? Cough? Productive? Color to sputum? More sputum than usual? Wheezing? Have you needed increased oxygen? Are you taking your respiratory medications? What over the counter measures have you tried?)  No Known Allergies  Immunization History  Administered Date(s) Administered   Influenza, High Dose Seasonal PF 08/25/2013, 11/06/2015, 05/30/2016   Influenza, Quadrivalent, Recombinant, Inj, Pf 07/03/2017, 07/28/2018, 07/15/2019, 07/26/2020, 08/01/2021   PFIZER(Purple Top)SARS-COV-2 Vaccination 11/12/2019, 12/08/2019, 08/17/2020   Pneumococcal Conjugate-13 11/01/2014

## 2022-07-22 NOTE — Telephone Encounter (Signed)
I called pt to offer acute visit with Dr Erin Fulling tomorrow 07/22/22  There was no answer- LMTCB and held spot in case he can be seen  Will forward back to triage to call again in the am

## 2022-07-23 ENCOUNTER — Encounter: Payer: Self-pay | Admitting: Pulmonary Disease

## 2022-07-23 ENCOUNTER — Ambulatory Visit: Payer: Medicare HMO | Admitting: Pulmonary Disease

## 2022-07-23 VITALS — BP 128/70 | HR 65 | Ht 66.0 in | Wt 131.4 lb

## 2022-07-23 DIAGNOSIS — R1310 Dysphagia, unspecified: Secondary | ICD-10-CM

## 2022-07-23 DIAGNOSIS — R0602 Shortness of breath: Secondary | ICD-10-CM | POA: Diagnosis not present

## 2022-07-23 DIAGNOSIS — R490 Dysphonia: Secondary | ICD-10-CM

## 2022-07-23 NOTE — Telephone Encounter (Signed)
Patient has been scheduled to see Dr. Erin Fulling this afternoon at 245pm. Will close encounter.

## 2022-07-23 NOTE — Progress Notes (Signed)
Synopsis: Referred in December 2022 for abnormal chest x-ray by Holland Commons, FNP  Subjective:   PATIENT ID: Thomas Bond GENDER: male DOB: 08-31-1941, MRN: [id number]  HPI  Chief Complaint  Patient presents with   Acute Visit    Increased wheezing since 11/2. Denied fever and body aches. Does have a productive cough.    Thomas Bond is an 81 year old male, never smoker with GERD, hypertension, dysphagia and history of stroke who returns to pulmonary clinic for acute visit.   He was seen by Dr. Ander Slade in acute visit 07/18/22 where he was treated with prednisone and azithromycin for dyspnea, wheezing and cough. He reports the dyspne and wheezing are better. He reports his dysphagia is progressive. He has lost 9lbs since 11/2 due to difficulty eating. He reports progressive changes in his voice that is muffled. Ofev was stopped at last visit in August.  His wife has been in the hospital recovering from covid infection complicated by bacterial pneumonia.   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 Evidence of gastritis s/p biopsies. He denies any significant changes in his ability to swallow or change in his voice.   He has been taking ofev 150 1 tab daily over the past month. He cut back from twice daily. He continues to have queasy stomach and diarrhea. His weight is stable from last visit. He goes back to bed one to two times per day which is new for him.   OV 02/12/22 He was seen by ENT on 01/16/22, note reviewed. No significant findings on flexible laryngoscopy to explain his hoarseness of voice.   He saw Speech Therapy 01/01/22 who recommended updated MBS and ongoing therapy for pharyngeal strengthening.   He started on ofev 112m 1 tab twice daily since last visit. He has lost 8lbs since last visit. He has lost 14lbs since the beginning of the year. Since starting ofev he has decreased appetite and  is having loose stools 2-3 times per day.   OV 12/21/21 He met with our pharmacy team on 10/17/21 and has not received Esbriet yet.  He had modified barium swallow as well as esophagram performed in January 2023 where he was noted to have the barium tablet lodged in the left piriform sinus.  He continues to have hoarse voice and trouble swallowing.  He is scheduled to see ENT in May.  Pulmonary function test 10/12/2021 showed mixed obstructive and restrictive defects along with significant bronchodilator response and moderate diffusion defect.  I reviewed his HRCT chest scan findings and pulmonary function test findings with the patient and his wife.  We had detailed discussion about the progression of pulmonary fibrosis and thinking about their advanced directives.  OV 10/12/21 HRCT Chest 08/24/21 shows evidence of usual interstitial pneumonia with widespread areas of ground glass atenuation, septal thickening, subpleural reticulation, traction bronchiectasis and peripheral bronchiolectasis with very mild honeycombing in the lung bases.   PFTs today show mild restriction with moderate diffusion defect.   He did not notice benefit from [facility].   He continues to have hoarseness of voice which is new over recent months. MBS from 2019 noted from office visit 08/17/21 with anterior cervical spine intrusion of pharynx/esophagus.  OV 08/17/21 Patient reports having shortness of breath over the past couple of years.  He has been tried on Asmanex as needed which he did not notice much improvement in his  symptoms.  He has issues with dysphagia and has been evaluated by speech therapy multiple times over the past years.  Modified barium swallow from 2019 which shows mild pharyngeal cervical esophageal dysphagia due to mechanical obstruction from anterior cervical spine.  No aspiration observed and trace laryngeal penetration of thin liquids from vallecular residuals spilling into open airway cleared  with further swallow.  Epiglottic deflection decreased likely due to cervical spine protrusion into pharynx which contributes to pharyngeal residuals.  He denies any issues with frequent pneumonias over the past years.  He denies any cough but does report wheezing.  He does report hoarseness in his voice which has been more constant over recent months.  He denies any seasonal allergies.  He denies any sinus congestion or postnasal drainage.  He is a never smoker but is exposed to significant secondhand smoke growing up as his parents smoked in the household.  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  He complains that his feet feel swollen in the morning time but when he checks them they do not appear swollen.  Past Medical History:  Diagnosis Date   Aortic atherosclerosis (HCC)    GERD (gastroesophageal reflux disease)    Gout    Hypertension    Pulmonary fibrosis (HCC)    Sleep apnea 2013   Not CPAP   TIA      Family History  Problem Relation Age of Onset   Alzheimer's disease Mother    Heart attack Brother 34   Stroke Neg Hx      Social History   Socioeconomic History   Marital status: Married    Spouse name: Not on file   Number of children: Not on file   Years of education: Not on file   Highest education level: Not on file  Occupational History   Not on file  Tobacco Use   Smoking status: Never   Smokeless tobacco: Never  Vaping Use   Vaping Use: Never used  Substance and Sexual Activity   Alcohol use: Yes    Comment: occasionally beer on weekends   Drug use: No   Sexual activity: Not on file  Other Topics Concern   Not on file  Social History Narrative   Lives with wife.  4 children over two marriages.  Retired Psychiatric nurse of Radio broadcast assistant Strain: Not on [employer] Insecurity: Not on file  Transportation Needs: Not on file  Physical Activity: Not on file   Stress: Not on file  Social Connections: Not on file  Intimate Partner Violence: Not on file     No Known Allergies   Outpatient Medications Prior to Visit  Medication Sig Dispense Refill   allopurinol (ZYLOPRIM) 300 MG tablet Take 300 mg by mouth daily.     amLODipine (NORVASC) 5 MG tablet Take 5 mg by mouth daily.     atenolol (TENORMIN) 50 MG tablet Take 50 mg by mouth daily.     cetirizine (ZYRTEC) 10 MG tablet Take 10 mg by mouth daily as needed for allergies.     clopidogrel (PLAVIX) 75 MG tablet Take 75 mg by mouth daily.     hydrochlorothiazide (MICROZIDE) 12.5 MG capsule Take 12.5 mg by mouth daily.     levothyroxine (SYNTHROID) 25 MCG tablet Take 25 mcg by mouth daily before breakfast.     sertraline (ZOLOFT) 50 MG tablet Take 50 mg by mouth daily.  pantoprazole (PROTONIX) 40 MG tablet Take 1 tablet (40 mg total) by mouth 2 (two) times daily. 60 tablet 1   azithromycin (ZITHROMAX) 250 MG tablet Take 1 tablet (250 mg total) by mouth daily. Take 2 tablets on day 1 and then 1 tablet daily for 4 days 6 tablet 0   predniSONE (DELTASONE) 20 MG tablet Take 2 tablets (40 mg total) by mouth daily with breakfast for 5 days. 10 tablet 0   No facility-administered medications prior to visit.   Review of Systems  Constitutional:  Negative for chills, fever, malaise/fatigue and weight loss.  HENT:  Negative for congestion, sinus pain and sore throat.        Difficulty swallowing  Eyes: Negative.   Respiratory:  Positive for cough and shortness of breath. Negative for hemoptysis, sputum production and wheezing.   Cardiovascular:  Negative for chest pain, palpitations, orthopnea, claudication and leg swelling.  Gastrointestinal:  Negative for abdominal pain, heartburn, nausea and vomiting.  Genitourinary: Negative.   Musculoskeletal:  Negative for joint pain and myalgias.  Skin:  Negative for rash.  Neurological:  Negative for weakness.  Endo/Heme/Allergies: Negative.    Psychiatric/Behavioral: Negative.      Objective:   Vitals:   07/23/22 1500  BP: 128/70  Pulse: 65  SpO2: 99%  Weight: 131 lb 6.4 oz (59.6 kg)  Height: _0  (1.676 m)    Physical Exam Constitutional:      General: He is not in acute distress. HENT:     Head: Normocephalic and atraumatic.     Salivary Glands: Right salivary gland is not diffusely enlarged. Left salivary gland is not diffusely enlarged.     Comments: Muffled voice    Mouth/Throat:     Mouth: Mucous membranes are dry. No oral lesions.     Palate: No mass and lesions.     Pharynx: Oropharynx is clear.     Tonsils: No tonsillar exudate.  Eyes:     Conjunctiva/sclera: Conjunctivae normal.  Neck:     Comments: Soft tissue mass along right neck, mobile Cardiovascular:     Rate and Rhythm: Normal rate and regular rhythm.     Pulses: Normal pulses.     Heart sounds: Normal heart sounds. No murmur heard. Pulmonary:     Breath sounds: Rales (Bibasilar) present.  Musculoskeletal:     Cervical back: No tenderness.     Right lower leg: No edema.     Left lower leg: No edema.  Lymphadenopathy:     Cervical: No cervical adenopathy.  Skin:    General: Skin is warm and dry.  Neurological:     General: No focal deficit present.     Mental Status: He is alert.  Psychiatric:        Mood and Affect: Mood normal.        Behavior: Behavior normal.        Thought Content: Thought content normal.        Judgment: Judgment normal.    CBC    Component Value Date/Time   WBC 10.7 (H) 06/22/2022 1722   RBC 3.98 (L) 06/22/2022 1722   HGB 12.4 (L) 06/22/2022 1722   HCT 36.5 (L) 06/22/2022 1722   PLT 223 06/22/2022 1722   MCV 91.7 06/22/2022 1722   MCH 31.2 06/22/2022 1722   MCHC 34.0 06/22/2022 1722   RDW 13.1 06/22/2022 1722   LYMPHSABS 3.8 05/22/2016 1829   MONOABS 0.9 05/22/2016 1829   EOSABS 0.6 05/22/2016 1829   BASOSABS  0.1 05/22/2016 1829      Latest Ref Rng & Units 06/22/2022    5:22 PM 05/22/2016     6:38 PM 05/22/2016    6:29 PM  BMP  Glucose 70 - 99 mg/dL 93  94  95   BUN 8 - 23 mg/dL _0 Creatinine 0.61 - 1.24 mg/dL 1.26  1.20  1.28   Sodium 135 - 145 mmol/L 138  140  137   Potassium 3.5 - 5.1 mmol/L 3.4  3.5  3.4   Chloride 98 - 111 mmol/L 96  100  100   CO2 22 - 32 mmol/L 28   25   Calcium 8.9 - 10.3 mg/dL 9.3   9.5    Chest imaging: CT Chest PE Study 06/22/22 Cardiovascular: There are no intraluminal filling defects in pulmonary artery branches. Contrast density in thoracic aorta is less than adequate to evaluate the lumen. Calcifications are seen in aortic annulus. There is ectasia of ascending thoracic aorta measuring 3.9 cm. Coronary artery calcifications are seen. Heart is enlarged in size.   Mediastinum/Nodes: Subcentimeter nodes are seen in mediastinum. Thyroid is not distinctly visualized.   Lungs/Pleura: Increased interstitial markings are seen with subpleural honeycombing in both lungs suggesting chronic interstitial lung disease, possibly UIP. There is no focal pulmonary consolidation. There is no pleural effusion or pneumothorax.   Upper Abdomen: There is 12 cm cyst in the upper pole of left kidney.  HRCT Chest 08/24/21 1. The appearance of the lungs is considered diagnostic of usual interstitial pneumonia (UIP) per current ATS guidelines. 2. Aortic atherosclerosis, in addition to left main and 3 vessel coronary artery disease. 3. There are calcifications of the aortic valve. Echocardiographic correlation for evaluation of potential valvular dysfunction may be warranted if clinically indicated. 4. Morphologic changes in the liver, suggestive of underlying cirrhosis.  PFT:    Latest Ref Rng & Units 10/12/2021   12:51 PM  PFT Results  FVC-Pre L 2.35   FVC-Predicted Pre % 73   FVC-Post L 2.71   FVC-Predicted Post % 84   Pre FEV1/FVC % % 47   Post FEV1/FCV % % 73   FEV1-Pre L 1.11   FEV1-Predicted Pre % 49   FEV1-Post L 1.97   DLCO  uncorrected ml/min/mmHg 11.35   DLCO UNC% % 54   DLCO corrected ml/min/mmHg 11.35   DLCO COR %Predicted % 54   DLVA Predicted % 69   TLC L 4.38   TLC % Predicted % 71   RV % Predicted % 80   2023: Mild restriction, moderate diffusion defect  Labs:  Path:  Echo:  Heart Catheterization:  Assessment & Plan:   Dysphagia, unspecified type - Plan: CT SOFT TISSUE NECK W CONTRAST  Shortness of breath - Plan: CT SOFT TISSUE NECK W CONTRAST, Basic Metabolic Panel (BMET), Basic Metabolic Panel (BMET)  Hoarseness of voice - Plan: CT SOFT TISSUE NECK W CONTRAST  Discussion: Thomas Bond is an 81 year old male, never smoker with GERD, hypertension, dysphagia and history of stroke who returns to pulmonary clinic for pulmonary fibrosis for acute visit.   He has progressive weight loss despite stopping ofev therapy due to trouble swallowing. He has muffled voice. He does not have stridor on exam. We will check CT Neck. CT Chest from last month does not indicate any tracheal lesions. There is concern for mass effect in his upper airway leading to voice changes.   Will contact his ENT and GI  teams based on CT Neck results.   He is to complete steroid course as prescribed on 11/2.  Follow up in 1 month.   Freda Jackson, MD [facility] Office: [phone]   Current Outpatient Medications:    allopurinol (ZYLOPRIM) 300 MG tablet, Take 300 mg by mouth daily., Disp: , Rfl:    amLODipine (NORVASC) 5 MG tablet, Take 5 mg by mouth daily., Disp: , Rfl:    atenolol (TENORMIN) 50 MG tablet, Take 50 mg by mouth daily., Disp: , Rfl:    cetirizine (ZYRTEC) 10 MG tablet, Take 10 mg by mouth daily as needed for allergies., Disp: , Rfl:    clopidogrel (PLAVIX) 75 MG tablet, Take 75 mg by mouth daily., Disp: , Rfl:    hydrochlorothiazide (MICROZIDE) 12.5 MG capsule, Take 12.5 mg by mouth daily., Disp: , Rfl:    levothyroxine (SYNTHROID) 25 MCG tablet, Take 25 mcg by mouth  daily before breakfast., Disp: , Rfl:    sertraline (ZOLOFT) 50 MG tablet, Take 50 mg by mouth daily., Disp: , Rfl:    pantoprazole (PROTONIX) 40 MG tablet, Take 1 tablet (40 mg total) by mouth 2 (two) times daily., Disp: 60 tablet, Rfl: 1

## 2022-07-23 NOTE — Patient Instructions (Signed)
Complete course of prednisone and azithromycin as prescribed  Will check a CT Neck scan and will check your kidney function today to see if you can tolerate contrast  We will be in touch after the results are back and possibly get you back in touch with our ENT team  Follow up in 1 month

## 2022-07-24 LAB — BASIC METABOLIC PANEL
BUN: 37 mg/dL — ABNORMAL HIGH (ref 6–23)
CO2: 29 mEq/L (ref 19–32)
Calcium: 9.8 mg/dL (ref 8.4–10.5)
Chloride: 98 mEq/L (ref 96–112)
Creatinine, Ser: 1.21 mg/dL (ref 0.40–1.50)
GFR: 55.43 mL/min — ABNORMAL LOW (ref >60.00)
Glucose, Bld: 93 mg/dL (ref 70–99)
Potassium: 3.6 mEq/L (ref 3.5–5.1)
Sodium: 140 mEq/L (ref 135–145)

## 2022-07-26 ENCOUNTER — Telehealth: Payer: Self-pay

## 2022-07-26 NOTE — Telephone Encounter (Signed)
       Reason for call: ED-Follow up call       Patient  visited [facility] on 06/22/2022  for Chest Pain     Telephone encounter attempt :  3rd Attempt  A HIPAA compliant voice message was left requesting a return call.  Instructed patient to call back at [phone] at their earliest convenience.  Left message 3 times, will close follow up.   [facility]  [place], [place]  Main Phone: [phone]  E-mail: [email or web address]  Website: [email or web address]

## 2022-07-27 ENCOUNTER — Telehealth: Payer: Self-pay | Admitting: Pulmonary Disease

## 2022-07-27 ENCOUNTER — Encounter (HOSPITAL_COMMUNITY): Payer: Self-pay

## 2022-07-27 ENCOUNTER — Emergency Department (HOSPITAL_COMMUNITY)
Admission: EM | Admit: 2022-07-27 | Discharge: 2022-07-27 | Disposition: A | Discharge status: Home / Self Care | Payer: Medicare HMO | Attending: Emergency Medicine | Admitting: Emergency Medicine

## 2022-07-27 ENCOUNTER — Emergency Department (HOSPITAL_COMMUNITY): Payer: Medicare HMO

## 2022-07-27 ENCOUNTER — Other Ambulatory Visit: Payer: Self-pay

## 2022-07-27 ENCOUNTER — Emergency Department (HOSPITAL_COMMUNITY)
Admission: RE | Admit: 2022-07-27 | Discharge: 2022-07-27 | Disposition: A | Discharge status: Home / Self Care | Payer: Medicare HMO | Source: Ambulatory Visit | Attending: Pulmonary Disease | Admitting: Pulmonary Disease

## 2022-07-27 ENCOUNTER — Other Ambulatory Visit: Payer: Self-pay | Admitting: Pulmonary Disease

## 2022-07-27 DIAGNOSIS — Z7902 Long term (current) use of antithrombotics/antiplatelets: Secondary | ICD-10-CM | POA: Diagnosis not present

## 2022-07-27 DIAGNOSIS — R000 Tachycardia, unspecified: Secondary | ICD-10-CM | POA: Insufficient documentation

## 2022-07-27 DIAGNOSIS — R490 Dysphonia: Secondary | ICD-10-CM | POA: Insufficient documentation

## 2022-07-27 DIAGNOSIS — J982 Interstitial emphysema: Secondary | ICD-10-CM | POA: Diagnosis not present

## 2022-07-27 DIAGNOSIS — Z79899 Other long term (current) drug therapy: Secondary | ICD-10-CM | POA: Insufficient documentation

## 2022-07-27 DIAGNOSIS — R1310 Dysphagia, unspecified: Secondary | ICD-10-CM

## 2022-07-27 DIAGNOSIS — E876 Hypokalemia: Secondary | ICD-10-CM | POA: Diagnosis not present

## 2022-07-27 DIAGNOSIS — R221 Localized swelling, mass and lump, neck: Secondary | ICD-10-CM | POA: Diagnosis not present

## 2022-07-27 DIAGNOSIS — R9389 Abnormal findings on diagnostic imaging of other specified body structures: Secondary | ICD-10-CM | POA: Diagnosis present

## 2022-07-27 DIAGNOSIS — J984 Other disorders of lung: Secondary | ICD-10-CM | POA: Diagnosis not present

## 2022-07-27 DIAGNOSIS — R0602 Shortness of breath: Secondary | ICD-10-CM

## 2022-07-27 DIAGNOSIS — I10 Essential (primary) hypertension: Secondary | ICD-10-CM | POA: Diagnosis not present

## 2022-07-27 LAB — CBC WITH DIFFERENTIAL/PLATELET
Abs Immature Granulocytes: 0.06 10*3/uL (ref 0.00–0.07)
Basophils Absolute: 0 10*3/uL (ref 0.0–0.1)
Basophils Relative: 0 %
Eosinophils Absolute: 0.1 10*3/uL (ref 0.0–0.5)
Eosinophils Relative: 1 %
HCT: 40.2 % (ref 39.0–52.0)
Hemoglobin: 13.4 g/dL (ref 13.0–17.0)
Immature Granulocytes: 0 %
Lymphocytes Relative: 11 %
Lymphs Abs: 1.7 10*3/uL (ref 0.7–4.0)
MCH: 31.2 pg (ref 26.0–34.0)
MCHC: 33.3 g/dL (ref 30.0–36.0)
MCV: 93.7 fL (ref 80.0–100.0)
Monocytes Absolute: 1.5 10*3/uL — ABNORMAL HIGH (ref 0.1–1.0)
Monocytes Relative: 10 %
Neutro Abs: 12.3 10*3/uL — ABNORMAL HIGH (ref 1.7–7.7)
Neutrophils Relative %: 78 %
Platelets: 291 10*3/uL (ref 150–400)
RBC: 4.29 MIL/uL (ref 4.22–5.81)
RDW: 13.3 % (ref 11.5–15.5)
WBC: 15.7 10*3/uL — ABNORMAL HIGH (ref 4.0–10.5)
nRBC: 0 % (ref 0.0–0.2)

## 2022-07-27 LAB — BASIC METABOLIC PANEL
Anion gap: 11 (ref 5–15)
BUN: 37 mg/dL — ABNORMAL HIGH (ref 8–23)
CO2: 28 mmol/L (ref 22–32)
Calcium: 9.3 mg/dL (ref 8.9–10.3)
Chloride: 99 mmol/L (ref 98–111)
Creatinine, Ser: 1.21 mg/dL (ref 0.61–1.24)
GFR, Estimated: 59 mL/min — ABNORMAL LOW (ref >60)
Glucose, Bld: 103 mg/dL — ABNORMAL HIGH (ref 70–99)
Potassium: 2.8 mmol/L — ABNORMAL LOW (ref 3.5–5.1)
Sodium: 138 mmol/L (ref 135–145)

## 2022-07-27 MED ORDER — POTASSIUM CHLORIDE 20 MEQ PO PACK: 40.0000 meq | PACK | Freq: Every day | ORAL | 0 refills | Status: AC

## 2022-07-27 MED ORDER — SODIUM CHLORIDE 0.9 % IV BOLUS
1000.0000 mL | Freq: Once | INTRAVENOUS | Status: AC
  Administered 2022-07-27: 1000 mL via INTRAVENOUS

## 2022-07-27 MED ORDER — POTASSIUM CHLORIDE 20 MEQ PO PACK
40.0000 meq | PACK | Freq: Once | ORAL | Status: AC
  Administered 2022-07-27: 40 meq via ORAL
  Filled 2022-07-27: qty 2

## 2022-07-27 MED ORDER — HYDROCODONE BIT-HOMATROP MBR 5-1.5 MG/5ML PO SOLN: 5.0000 mL | Freq: Four times a day (QID) | ORAL | 0 refills | Status: DC | PRN

## 2022-07-27 MED ORDER — IOHEXOL 300 MG/ML  SOLN
75.0000 mL | Freq: Once | INTRAMUSCULAR | Status: AC | PRN
  Administered 2022-07-27: 75 mL via INTRAVENOUS

## 2022-07-27 MED ORDER — POTASSIUM CHLORIDE 10 MEQ/100ML IV SOLN
10.0000 meq | INTRAVENOUS | Status: AC
  Administered 2022-07-27 (×3): 10 meq via INTRAVENOUS
  Filled 2022-07-27 (×3): qty 100

## 2022-07-27 NOTE — ED Provider Triage Note (Signed)
Emergency Medicine Provider Triage Evaluation Note  Thomas Bond , a 81 y.o. male  was evaluated in triage.  Pt complains of difficulty with breathing and swallowing.  He has had issues with swallowing for upwards of 2 years.  He had an outpatient CT scan of soft tissue of his neck this morning which showed a pneumomediastinum.  He was sent here by his outpatient provider for further evaluation and management.  Patient does endorse history of pulmonary fibrosis.  Endorses shortness of breath, dysphagia, hoarse voice.  Review of Systems  Positive: As above Negative: As above  Physical Exam  BP (!) 169/77 (BP Location: Left Arm)   Pulse (!) 106   Temp 97.9 F (36.6 C) (Oral)   Resp (!) 22   SpO2 92%  Gen:   Awake, no distress   Resp:  Normal effort  MSK:   Moves extremities without difficulty  Other:    Medical Decision Making  Medically screening exam initiated at 2:49 PM.  Appropriate orders placed.  Thomas Bond was informed that the remainder of the evaluation will be completed by another provider, this initial triage assessment does not replace that evaluation, and the importance of remaining in the ED until their evaluation is complete.     Dorothyann Peng, PA-C 07/27/22 1451

## 2022-07-27 NOTE — ED Triage Notes (Signed)
This morning patient went for CT scan of his throat. Having difficulty swallowing and breathing. They got a call that said he has air in the wrong place. They did not provide further information. Has pulmonary fibrosis. Hoarse to talk in triage. Has dysphagia.

## 2022-07-27 NOTE — Consult Note (Signed)
   NAME:  Thomas Bond, MRN:  [id number], DOB:  22-Apr-1941, LOS: 0 ADMISSION DATE:  07/27/2022, CONSULTATION DATE:  07/27/2022 REFERRING MD:  Shirlyn Goltz MD, CHIEF COMPLAINT: Subcutaneous emphysema, pneumomediastinum  History of Present Illness:   81 year old with with pulmonary fibrosis.  He got a CT scan of the neck as an outpatient for evaluation of hoarseness of voice, dysphagia which noted subcutaneous emphysema and pneumomediastinum.  We have advised him to come to the ED for further evaluation.  Previously on Ofev which was taken off in August for weight loss, upset stomach and diarrhea.  Has extensive evaluation by GI and EGD in the past for hoarseness of voice.  More recently he has had difficulty swallowing.  Pertinent  Medical History    has a past medical history of Aortic atherosclerosis ([facility]), GERD (gastroesophageal reflux disease), Gout, Hypertension, Pulmonary fibrosis ([place]), Sleep apnea (2013), and TIA.   Significant Hospital Events: Including procedures, antibiotic start and stop dates in addition to other pertinent events     Interim History / Subjective:    Objective   Blood pressure (!) 150/136, pulse 61, temperature 97.9 F (36.6 C), temperature source Oral, resp. rate (!) 22, SpO2 100 %.       No intake or output data in the 24 hours ending 07/27/22 1807 There were no vitals filed for this visit.  Examination: Gen:      No acute distress HEENT:  EOMI, sclera anicteric Neck:     No masses; no thyromegaly Lungs:    Basal crackles CV:         Regular rate and rhythm; no murmurs Abd:      + bowel sounds; soft, non-tender; no palpable masses, no distension Ext:    No edema; adequate peripheral perfusion Skin:      Warm and dry; no rash Neuro: alert and oriented x 3 Psych: normal mood and affect   CT chest pneumomediastinum with subcutaneous air as at the base of neck and right lateral chest wall.  UIP pulmonary fibrosis.  Resolved Hospital Problem list      Assessment & Plan:  Pneumomediastinum, subcutaneous emphysema Suspect that this may be secondary to cough, barotrauma in the setting of underlying interstitial lung disease.    There is no pneumothorax on imaging today.  He appears comfortable and is on room air. He would prefer to go home and I think he will be okay with observation at home and follow-up chest x-ray in the clinic on Monday Prescribe codeine cough syrup for cough suppression  He knows to come back to the ED if there is any sudden increase in dyspnea or chest pain  Right submandibular swelling He has a visible lump on the right side of the neck which has been biopsied 2 years ago and he was told it was benign.  Signature:   Marshell Garfinkel MD [facility] See Amion for pager  If no response to pager , please call [phone] until 7pm After 7:00 pm call Elink  [phone] 07/27/2022, 6:25 PM

## 2022-07-27 NOTE — ED Provider Notes (Signed)
[facility] DEPT Provider Note   CSN: [id number] Arrival date & time: 07/27/22  1430     History  Chief Complaint  Patient presents with   Abnormal CT scan    Thomas Bond is a 81 y.o. male history of pulmonary fibrosis, hypertension, here presenting with subcutaneous air on the CT neck.  Patient has been having trouble swallowing for the last several months.  Patient also has a lump on the right side of the neck that has been biopsied and showed benign.  He has worsening shortness of breath and neck pain so a CT neck outpatient was done and showed subcutaneous air.  Patient cannot has chronic shortness of breath from pulmonary fibrosis.  Denies any coughing spells or aspiration events.  The history is provided by the patient.       Home Medications Prior to Admission medications   Medication Sig Start Date End Date Taking? Authorizing Provider  allopurinol (ZYLOPRIM) 300 MG tablet Take 300 mg by mouth daily.    [provider]  amLODipine (NORVASC) 5 MG tablet Take 5 mg by mouth daily.    [provider]  atenolol (TENORMIN) 50 MG tablet Take 50 mg by mouth daily.    [provider]  cetirizine (ZYRTEC) 10 MG tablet Take 10 mg by mouth daily as needed for allergies.    [provider]  clopidogrel (PLAVIX) 75 MG tablet Take 75 mg by mouth daily.    [provider]  hydrochlorothiazide (MICROZIDE) 12.5 MG capsule Take 12.5 mg by mouth daily.    [provider]  levothyroxine (SYNTHROID) 25 MCG tablet Take 25 mcg by mouth daily before breakfast.    [provider]  pantoprazole (PROTONIX) 40 MG tablet Take 1 tablet (40 mg total) by mouth 2 (two) times daily. 04/16/22 05/16/22  Otis Brace, MD  sertraline (ZOLOFT) 50 MG tablet Take 50 mg by mouth daily.    [provider]      Allergies    Patient has no known allergies.    Review of Systems   Review of Systems   Respiratory:  Positive for shortness of breath.   All other systems reviewed and are negative.   Physical Exam Updated Vital Signs BP (!) 150/136   Pulse 61   Temp 97.9 F (36.6 C) (Oral)   Resp (!) 22   SpO2 100%  Physical Exam Vitals and nursing note reviewed.  Constitutional:      Comments: Chronically ill-appearing  HENT:     Head: Normocephalic.     Mouth/Throat:     Mouth: Mucous membranes are moist.  Eyes:     Extraocular Movements: Extraocular movements intact.     Pupils: Pupils are equal, round, and reactive to light.  Neck:     Comments: Patient has a mobile mass on the right side of the neck.  Some crepitus as well. Cardiovascular:     Rate and Rhythm: Tachycardia present.  Pulmonary:     Comments: Patient is tachypneic and some crepitus on the right anterior chest.  Patient has diminished breath sounds on the right side. Abdominal:     General: Abdomen is flat.     Palpations: Abdomen is soft.  Musculoskeletal:        General: Normal range of motion.  Skin:    General: Skin is warm.     Capillary Refill: Capillary refill takes less than 2 seconds.  Neurological:     General: No focal deficit  present.     Mental Status: He is oriented to person, place, and time.  Psychiatric:        Mood and Affect: Mood normal.        Behavior: Behavior normal.     ED Results / Procedures / Treatments   Labs (all labs ordered are listed, but only abnormal results are displayed) Labs Reviewed  BASIC METABOLIC PANEL - Abnormal; Notable for the following components:      Result Value   Potassium 2.8 (*)    Glucose, Bld 103 (*)    BUN 37 (*)    GFR, Estimated 59 (*)    All other components within normal limits  CBC WITH DIFFERENTIAL/PLATELET - Abnormal; Notable for the following components:   WBC 15.7 (*)    Neutro Abs 12.3 (*)    Monocytes Absolute 1.5 (*)    All other components within normal limits    EKG None  Radiology CT CHEST WO CONTRAST  Result  Date: 07/27/2022 CLINICAL DATA:  Chronic persistent cough, pneumomediastinum on neck CT EXAM: CT CHEST WITHOUT CONTRAST TECHNIQUE: Multidetector CT imaging of the chest was performed following the standard protocol without IV contrast. RADIATION DOSE REDUCTION: This exam was performed according to the departmental dose-optimization program which includes automated exposure control, adjustment of the mA and/or kV according to patient size and/or use of iterative reconstruction technique. COMPARISON:  07/27/2022, 06/22/2022 FINDINGS: Cardiovascular: Unenhanced imaging of the heart is unremarkable without pericardial effusion. Normal caliber of the thoracic aorta. Continued atherosclerosis of the aorta and coronary vasculature. Calcification of the aortic valve. Mediastinum/Nodes: Has partially visualized on earlier neck CT, there is diffuse pneumomediastinum. Subcutaneous gas is seen within the base of the neck and the right lateral chest wall/axilla. Thyroid, trachea, and esophagus are unremarkable. No pathologic adenopathy. Lungs/Pleura: Persistent subpleural scarring and fibrosis, with a peripheral and basilar predilection, unchanged since prior exam. Findings could reflect chronic interstitial lung disease such as UIP. No acute airspace disease, effusion, or pneumothorax. The central airways are patent. Upper Abdomen: No acute abnormality. Endoscopy clips again seen within the gastric lumen. Stable appearance of the left renal cyst, no further imaging follow-up is required. Musculoskeletal: No acute or destructive bony lesions. Reconstructed images demonstrate no additional findings. IMPRESSION: 1. Extensive pneumomediastinum, as well as subcutaneous gas within the base of the neck and right lateral chest wall. Given clinical presentation and known history of pulmonary scarring and fibrosis, this likely reflects sequela of barotrauma. 2. Persistent basilar predominant subpleural scarring and fibrosis, consistent  with chronic interstitial lung disease such as UIP. 3. Aortic Atherosclerosis (ICD10-I70.0). Coronary artery atherosclerosis. Electronically Signed   By: Randa Ngo M.D.   On: 07/27/2022 17:39   DG Chest Port 1 View  Result Date: 07/27/2022 CLINICAL DATA:  Shortness of breath.  Pneumomediastinum. EXAM: PORTABLE CHEST 1 VIEW COMPARISON:  CT chest 06/22/2022 and CT neck 07/27/2022 FINDINGS: Signs of pneumomediastinum identified. There is also soft tissue gas identified within the right lower neck and right axilla. Heart size and mediastinal contours are normal. No pleural effusion, interstitial edema or airspace disease. There is mild diffuse reticular interstitial opacities with a peripheral predominance corresponding to previously demonstrated chronic interstitial lung disease. No superimposed airspace consolidation. The visualized skeletal structures are unremarkable. IMPRESSION: 1. Signs of pneumomediastinum and soft tissue gas within the right lower neck and right axilla. No significant pneumothorax identified. Given the presence of chronic interstitial lung disease findings may reflect sequelae of barotrauma. 2. Chronic interstitial lung disease. Electronically  Signed   By: Kerby Moors M.D.   On: 07/27/2022 16:46   CT SOFT TISSUE NECK W CONTRAST  Addendum Date: 07/27/2022   ADDENDUM REPORT: 07/27/2022 11:05 ADDENDUM: Study discussed by telephone with Marshell Garfinkel, MD, on-call for Dr. Freda Jackson on 07/27/2022 at 1056 hours. Electronically Signed   By: Genevie Ann M.D.   On: 07/27/2022 11:05   Result Date: 07/27/2022 CLINICAL DATA:  81 year old male with shortness of breath and hoarseness. EXAM: CT NECK WITH CONTRAST TECHNIQUE: Multidetector CT imaging of the neck was performed using the standard protocol following the bolus administration of intravenous contrast. RADIATION DOSE REDUCTION: This exam was performed according to the departmental dose-optimization program which includes  automated exposure control, adjustment of the mA and/or kV according to patient size and/or use of iterative reconstruction technique. CONTRAST:  77m OMNIPAQUE IOHEXOL 300 MG/ML  SOLN COMPARISON:  CTA chest 06/22/2022. Brain MRI 05/22/2016. FINDINGS: Pharynx and larynx: Mild motion artifact. Subsequently, larynx and pharynx soft tissue contours are within normal limits. No abnormal mucosal space enhancement. Superior parapharyngeal and retropharyngeal spaces are normal. But there is right lower parapharyngeal space soft tissue gas which increases into the lower neck and at the thoracic inlet, see below. Salivary glands: Negative sublingual space and submandibular glands. Parotid glands are within normal limits. Thyroid: Negative except for surrounding deep soft tissue space gas tracking cephalad from the superior mediastinum, see below. Lymph nodes: Circumscribed dorsal midline soft tissue cyst inseparable from the dermis, likely sebaceous cyst or similar lesion. Mixed macroscopic fat and nodular soft tissue subcutaneous mass in the right neck submandibular space (series 2, image 54 and coronal image 48) up to 3 cm diameter with soft tissue nodularity in addition to macroscopic fat which is up to 1.6 cm short axis. No regional inflammation. This was not apparent on the 2017 MRI. No superimposed cervical lymphadenopathy. Vascular: Major arterial structures in the neck and at the skull base are patent despite atherosclerosis. Visible IJ bulbs, sigmoid and transverse venous sinuses are also patent. Limited intracranial: Negative. Visualized orbits: Negative. Mastoids and visualized paranasal sinuses: Clear throughout. Skeleton: Bulky cervical spine degeneration with Diffuse idiopathic skeletal hyperostosis (DISH). Associated cervical spinal stenosis, up to moderate at the C3-C4 and C6-C7 levels. Flowing anterior endplate osteophytes in the upper thoracic spine resulting in upper thoracic ankylosis. No acute or  suspicious osseous lesion identified. Upper chest: Moderate volume pneumomediastinum is visible and extends cephalad into the neck mostly on the right. Apical chronic lung disease with subpleural scarring, with chronic bilateral pulmonary interstitial disease better demonstrated on 06/22/2022. No convincing apical pneumothorax. No acute upper lung inflammation. No superimposed superior mediastinal fluid or lymphadenopathy. Partially visible Calcified aortic atherosclerosis. IMPRESSION: 1. Positive for Pneumomediastinum, tracking into the lower neck more so on the right. In the absence of penetrating trauma and considering chronic interstitial lung disease (CTA Chest 06/22/2022), favor this is Barotrauma related. Recommend follow-up Chest imaging. 2. Superficial 3 cm mixed fat and soft tissue mass in the right neck submandibular space is suspicious for a low-grade Liposarcoma and seems to be new since 2017. 3. No other acute or inflammatory process identified in the neck. Bulky cervical spine degeneration with Diffuse idiopathic skeletal hyperostosis (DISH) and up to moderate at the C3-C4 and C6-C7 levels. Aortic Atherosclerosis (ICD10-I70.0). Electronically Signed: By: HGenevie AnnM.D. On: 07/27/2022 09:58    Procedures Procedures    Medications Ordered in ED Medications  potassium chloride 10 mEq in 100 mL IVPB (10 mEq Intravenous New Bag/Given  07/27/22 1727)  potassium chloride (KLOR-CON) packet 40 mEq (has no administration in time range)  sodium chloride 0.9 % bolus 1,000 mL (0 mLs Intravenous Stopped 07/27/22 1729)    ED Course/ Medical Decision Making/ A&P                           Medical Decision Making UZZIEL RUSSEY is a 81 y.o. male here presenting with shortness of breath and mediastinum.  I discussed case with the critical care doctor, Dr. Vaughan Browner.  He was the one who sent the patient in for evaluation.  He recommended noncontrast CT of the chest to further evaluate pneumomediastinum and  to rule out a pneumothorax.  6:15 PM I reviewed patient labs and independently interpreted imaging studies.  Potassium is 2.8 and is supplemented.  CT chest showed extensive pneumomediastinum.  This is likely secondary to pulmonary fibrosis.  I discussed case with Dr. Vaughan Browner from critical care.  He actually sent the patient in.  He evaluated the patient and felt that this likely was a old barotrauma.  Since patient is on room air and has no oxygen requirement, he felt that patient can be discharged.  He will see patient in clinic on Monday for repeat chest x-ray.   7:17 PM Patient tolerated oral potassium and is getting IV potassium infusions. Patient can get outpatient BMP in the week with his doctor.  We will start him on potassium supplementation.  He also has follow-up on Monday for repeat checks x-ray.  Problems Addressed: Hypokalemia: acute illness or injury Pneumomediastinum [facility]): acute illness or injury  Amount and/or Complexity of Data Reviewed Labs: ordered. Decision-making details documented in ED Course. Radiology: ordered and independent interpretation performed. Decision-making details documented in ED Course.  Risk Prescription drug management.    Final Clinical Impression(s) / ED Diagnoses Final diagnoses:  None    Rx / DC Orders ED Discharge Orders     None         Drenda Freeze, MD 07/27/22 1919

## 2022-07-27 NOTE — Discharge Instructions (Addendum)
Take potassium packet daily. You can mix it with apple sauce or water.   Go on Monday to pulmonary clinic for repeat xray   Return to ER if you have worse trouble breathing, trouble swallowing, neck pain

## 2022-07-27 NOTE — Telephone Encounter (Addendum)
PCCM note  Received call report from [facility] [place] radiology CT neck today 07/27/22 shows following findings  1. Positive for Pneumomediastinum, tracking into the lower neck more so on the right. In the absence of penetrating trauma and considering chronic interstitial lung disease (CTA Chest 06/22/2022), favor this is Barotrauma related. Recommend follow-up Chest imaging.   2. Superficial 3 cm mixed fat and soft tissue mass in the right neck submandibular space is suspicious for a low-grade Liposarcoma and seems to be new since 2017  I tried calling the contact numbers with the patient and his wife but did not have an answer.  I left a voicemail requesting callback.  When the patient calls back we may need to send him to the ED for evaluation if symptomatic with dyspnea otherwise if stable (since the ED is backed up) we can try to arrange chest x-ray on Monday and order stat CT chest without contrast for further evaluation.  He also need evaluation for the right neck and submandibular space lesion.  Marshell Garfinkel MD [facility] 07/27/2022, 11:09 AM   Addendum: I was able to call back and speak with the patient. He sounds very dyspneic and hoarse over the telephone so I advised him to go the ED to get evaluated.  Marshell Garfinkel MD See Amion for pager  If no response to pager , please call [phone] until 7pm After 7:00 pm call Elink  [phone] 07/27/2022, 1:24 PM

## 2022-08-01 ENCOUNTER — Ambulatory Visit: Payer: Medicare HMO | Admitting: Pulmonary Disease

## 2022-08-01 ENCOUNTER — Encounter: Payer: Self-pay | Admitting: Pulmonary Disease

## 2022-08-01 ENCOUNTER — Ambulatory Visit (INDEPENDENT_AMBULATORY_CARE_PROVIDER_SITE_OTHER): Payer: Medicare HMO

## 2022-08-01 VITALS — BP 122/70 | HR 55 | Ht 66.0 in | Wt 132.0 lb

## 2022-08-01 DIAGNOSIS — J8410 Pulmonary fibrosis, unspecified: Secondary | ICD-10-CM

## 2022-08-01 DIAGNOSIS — J982 Interstitial emphysema: Secondary | ICD-10-CM

## 2022-08-01 DIAGNOSIS — R1310 Dysphagia, unspecified: Secondary | ICD-10-CM

## 2022-08-01 DIAGNOSIS — J439 Emphysema, unspecified: Secondary | ICD-10-CM | POA: Diagnosis not present

## 2022-08-01 NOTE — Patient Instructions (Addendum)
We will check chest x-ray today  I will follow up with the ENT team about getting you scheduled with the ENT team at [facility]  Follow up in 2 months

## 2022-08-01 NOTE — Progress Notes (Signed)
Synopsis: Referred in December 2022 for abnormal chest x-ray by Holland Commons, FNP  Subjective:   PATIENT ID: Thomas Bond GENDER: male DOB: 11/05/1940, MRN: [id number]  HPI  Chief Complaint  Patient presents with   Follow-up    ED f/u after CT scan. Still has the DOE and loss of voice. Denies any worsening symptoms.    Thomas Bond is an 81 year old male, never smoker with GERD, hypertension, dysphagia and history of stroke who returns to pulmonary clinic for acute visit.   He had neck CT which showed evidence of pneumomediastinum and was sent to the ER for further evaluation with CT Chest. He was stable at that time and discharged home with close follow.   I discussed patient's case with ENT yesterday and there is concern that the cervical osteophytes could be leading to extrinsic compression on his upper airway with dysphagia and dysphonia. They recommended referral to [facility] main campus for further swallowing evaluation and testing.   He has not had progression of symptoms regarding pneumomediastinum. He continues to have difficulty swallowing. His son is in attendance with him today.   OV 07/23/22 He was seen by Dr. Ander Slade in acute visit 07/18/22 where he was treated with prednisone and azithromycin for dyspnea, wheezing and cough. He reports the dyspne and wheezing are better. He reports his dysphagia is progressive. He has lost 9lbs since 11/2 due to difficulty eating. He reports progressive changes in his voice that is muffled. Ofev was stopped at last visit in August.  His wife has been in the hospital recovering from covid infection complicated by bacterial pneumonia.   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 Evidence of gastritis s/p biopsies. He denies any significant changes in his ability to swallow or change in his voice.   He has been taking ofev 150 1 tab daily over the past  month. He cut back from twice daily. He continues to have queasy stomach and diarrhea. His weight is stable from last visit. He goes back to bed one to two times per day which is new for him.   OV 02/12/22 He was seen by ENT on 01/16/22, note reviewed. No significant findings on flexible laryngoscopy to explain his hoarseness of voice.   He saw Speech Therapy 01/01/22 who recommended updated MBS and ongoing therapy for pharyngeal strengthening.   He started on ofev 166m 1 tab twice daily since last visit. He has lost 8lbs since last visit. He has lost 14lbs since the beginning of the year. Since starting ofev he has decreased appetite and is having loose stools 2-3 times per day.   OV 12/21/21 He met with our pharmacy team on 10/17/21 and has not received Esbriet yet.  He had modified barium swallow as well as esophagram performed in January 2023 where he was noted to have the barium tablet lodged in the left piriform sinus.  He continues to have hoarse voice and trouble swallowing.  He is scheduled to see ENT in May.  Pulmonary function test 10/12/2021 showed mixed obstructive and restrictive defects along with significant bronchodilator response and moderate diffusion defect.  I reviewed his HRCT chest scan findings and pulmonary function test findings with the patient and his wife.  We had detailed discussion about the progression of pulmonary fibrosis and thinking about their advanced directives.  OV 10/12/21 HRCT Chest 08/24/21 shows evidence of usual interstitial pneumonia  with widespread areas of ground glass atenuation, septal thickening, subpleural reticulation, traction bronchiectasis and peripheral bronchiolectasis with very mild honeycombing in the lung bases.   PFTs today show mild restriction with moderate diffusion defect.   He did not notice benefit from [facility] ellipta inhaler.   He continues to have hoarseness of voice which is new over recent months. MBS from 2019 noted from office  visit 08/17/21 with anterior cervical spine intrusion of pharynx/esophagus.  OV 08/17/21 Patient reports having shortness of breath over the past couple of years.  He has been tried on Asmanex as needed which he did not notice much improvement in his symptoms.  He has issues with dysphagia and has been evaluated by speech therapy multiple times over the past years.  Modified barium swallow from 2019 which shows mild pharyngeal cervical esophageal dysphagia due to mechanical obstruction from anterior cervical spine.  No aspiration observed and trace laryngeal penetration of thin liquids from vallecular residuals spilling into open airway cleared with further swallow.  Epiglottic deflection decreased likely due to cervical spine protrusion into pharynx which contributes to pharyngeal residuals.  He denies any issues with frequent pneumonias over the past years.  He denies any cough but does report wheezing.  He does report hoarseness in his voice which has been more constant over recent months.  He denies any seasonal allergies.  He denies any sinus congestion or postnasal drainage.  He is a never smoker but is exposed to significant secondhand smoke growing up as his parents smoked in the household.  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  He complains that his feet feel swollen in the morning time but when he checks them they do not appear swollen.  Past Medical History:  Diagnosis Date   Aortic atherosclerosis (HCC)    GERD (gastroesophageal reflux disease)    Gout    Hypertension    Pulmonary fibrosis (HCC)    Sleep apnea 2013   Not CPAP   TIA      Family History  Problem Relation Age of Onset   Alzheimer's disease Mother    Heart attack Brother 66   Stroke Neg Hx      Social History   Socioeconomic History   Marital status: Married    Spouse name: Not on file   Number of children: Not on file   Years of education:  Not on file   Highest education level: Not on file  Occupational History   Not on file  Tobacco Use   Smoking status: Never   Smokeless tobacco: Never  Vaping Use   Vaping Use: Never used  Substance and Sexual Activity   Alcohol use: Yes    Comment: occasionally beer on weekends   Drug use: No   Sexual activity: Not on file  Other Topics Concern   Not on file  Social History Narrative   Lives with wife.  4 children over two marriages.  Retired Psychiatric nurse of Radio broadcast assistant Strain: Not on [employer] Insecurity: Not on file  Transportation Needs: Not on file  Physical Activity: Not on file  Stress: Not on file  Social Connections: Not on file  Intimate Partner Violence: Not on file     No Known Allergies   Outpatient Medications Prior to Visit  Medication Sig Dispense Refill   allopurinol (ZYLOPRIM) 300 MG tablet Take 300 mg by mouth daily.  amLODipine (NORVASC) 5 MG tablet Take 5 mg by mouth daily.     atenolol (TENORMIN) 50 MG tablet Take 50 mg by mouth daily.     cetirizine (ZYRTEC) 10 MG tablet Take 10 mg by mouth daily as needed for allergies.     clopidogrel (PLAVIX) 75 MG tablet Take 75 mg by mouth daily.     hydrochlorothiazide (MICROZIDE) 12.5 MG capsule Take 12.5 mg by mouth daily.     HYDROcodone bit-homatropine (HYCODAN) 5-1.5 MG/5ML syrup Take 5 mLs by mouth every 6 (six) hours as needed for cough. 240 mL 0   levothyroxine (SYNTHROID) 25 MCG tablet Take 25 mcg by mouth daily before breakfast.     potassium chloride (KLOR-CON) 20 MEQ packet Take 40 mEq by mouth daily. 30 packet 0   sertraline (ZOLOFT) 50 MG tablet Take 50 mg by mouth daily.     pantoprazole (PROTONIX) 40 MG tablet Take 1 tablet (40 mg total) by mouth 2 (two) times daily. 60 tablet 1   No facility-administered medications prior to visit.   Review of Systems  Constitutional:  Negative for chills, fever, malaise/fatigue and weight loss.  HENT:  Negative for  congestion, sinus pain and sore throat.        Difficulty swallowing  Eyes: Negative.   Respiratory:  Positive for cough and shortness of breath. Negative for hemoptysis, sputum production and wheezing.   Cardiovascular:  Negative for chest pain, palpitations, orthopnea, claudication and leg swelling.  Gastrointestinal:  Negative for abdominal pain, heartburn, nausea and vomiting.       Dysphagia  Genitourinary: Negative.   Musculoskeletal:  Negative for joint pain and myalgias.  Skin:  Negative for rash.  Neurological:  Negative for weakness.  Endo/Heme/Allergies: Negative.   Psychiatric/Behavioral: Negative.      Objective:   Vitals:   08/01/22 1141  BP: 122/70  Pulse: (!) 55  SpO2: 97%  Weight: 132 lb (59.9 kg)  Height: _0  (1.676 m)    Physical Exam Constitutional:      General: He is not in acute distress. HENT:     Head: Normocephalic and atraumatic.     Salivary Glands: Right salivary gland is not diffusely enlarged. Left salivary gland is not diffusely enlarged.     Comments: Muffled voice    Mouth/Throat:     Mouth: Mucous membranes are dry. No oral lesions.     Palate: No mass and lesions.     Pharynx: Oropharynx is clear.     Tonsils: No tonsillar exudate.  Eyes:     Conjunctiva/sclera: Conjunctivae normal.  Neck:     Comments: Soft tissue mass along right neck, mobile Cardiovascular:     Rate and Rhythm: Normal rate and regular rhythm.     Pulses: Normal pulses.     Heart sounds: Normal heart sounds. No murmur heard. Pulmonary:     Breath sounds: Rales (Bibasilar) present.  Musculoskeletal:     Cervical back: No tenderness.     Right lower leg: No edema.     Left lower leg: No edema.  Lymphadenopathy:     Cervical: No cervical adenopathy.  Skin:    General: Skin is warm and dry.  Neurological:     General: No focal deficit present.     Mental Status: He is alert.  Psychiatric:        Mood and Affect: Mood normal.        Behavior: Behavior  normal.        Thought Content:  Thought content normal.        Judgment: Judgment normal.    CBC    Component Value Date/Time   WBC 15.7 (H) 07/27/2022 1552   RBC 4.29 07/27/2022 1552   HGB 13.4 07/27/2022 1552   HCT 40.2 07/27/2022 1552   PLT 291 07/27/2022 1552   MCV 93.7 07/27/2022 1552   MCH 31.2 07/27/2022 1552   MCHC 33.3 07/27/2022 1552   RDW 13.3 07/27/2022 1552   LYMPHSABS 1.7 07/27/2022 1552   MONOABS 1.5 (H) 07/27/2022 1552   EOSABS 0.1 07/27/2022 1552   BASOSABS 0.0 07/27/2022 1552      Latest Ref Rng & Units 07/27/2022    3:52 PM 07/23/2022    3:49 PM 06/22/2022    5:22 PM  BMP  Glucose 70 - 99 mg/dL 103  93  93   BUN 8 - 23 mg/dL 37  37  20   Creatinine 0.61 - 1.24 mg/dL 1.21  1.21  1.26   Sodium 135 - 145 mmol/L 138  140  138   Potassium 3.5 - 5.1 mmol/L 2.8  3.6  3.4   Chloride 98 - 111 mmol/L 99  98  96   CO2 22 - 32 mmol/L _0 Calcium 8.9 - 10.3 mg/dL 9.3  9.8  9.3    Chest imaging: CXR 08/01/22 1. Mild, residual pneumomediastinum and subcutaneous emphysema about the right chest wall, improved compared to prior examination. 2. Fibrotic interstitial opacity in keeping with prior CT findings, without acute appearing airspace opacity.  CT Chest PE Study 06/22/22 Cardiovascular: There are no intraluminal filling defects in pulmonary artery branches. Contrast density in thoracic aorta is less than adequate to evaluate the lumen. Calcifications are seen in aortic annulus. There is ectasia of ascending thoracic aorta measuring 3.9 cm. Coronary artery calcifications are seen. Heart is enlarged in size.   Mediastinum/Nodes: Subcentimeter nodes are seen in mediastinum. Thyroid is not distinctly visualized.   Lungs/Pleura: Increased interstitial markings are seen with subpleural honeycombing in both lungs suggesting chronic interstitial lung disease, possibly UIP. There is no focal pulmonary consolidation. There is no pleural effusion or  pneumothorax.   Upper Abdomen: There is 12 cm cyst in the upper pole of left kidney.  HRCT Chest 08/24/21 1. The appearance of the lungs is considered diagnostic of usual interstitial pneumonia (UIP) per current ATS guidelines. 2. Aortic atherosclerosis, in addition to left main and 3 vessel coronary artery disease. 3. There are calcifications of the aortic valve. Echocardiographic correlation for evaluation of potential valvular dysfunction may be warranted if clinically indicated. 4. Morphologic changes in the liver, suggestive of underlying cirrhosis.  PFT:    Latest Ref Rng & Units 10/12/2021   12:51 PM  PFT Results  FVC-Pre L 2.35   FVC-Predicted Pre % 73   FVC-Post L 2.71   FVC-Predicted Post % 84   Pre FEV1/FVC % % 47   Post FEV1/FCV % % 73   FEV1-Pre L 1.11   FEV1-Predicted Pre % 49   FEV1-Post L 1.97   DLCO uncorrected ml/min/mmHg 11.35   DLCO UNC% % 54   DLCO corrected ml/min/mmHg 11.35   DLCO COR %Predicted % 54   DLVA Predicted % 69   TLC L 4.38   TLC % Predicted % 71   RV % Predicted % 80   2023: Mild restriction, moderate diffusion defect  Labs:  Path:  Echo:  Heart Catheterization:  Assessment & Plan:   Pneumomediastinum ([place]) -  Plan: DG Chest 2 View  Dysphagia, unspecified type  Pulmonary fibrosis ([place])  Discussion: Thomas Bond is an 81 year old male, never smoker with GERD, hypertension, dysphagia and history of stroke who returns to pulmonary clinic for pulmonary fibrosis for acute visit.   He has progressive weight loss despite stopping ofev therapy due to trouble swallowing. He has muffled voice. He does not have stridor on exam. He had pneumomediastinum noted on recent CT Neck and chest scans. The pneumomediastinum is resolving based on chest x-ray today. His case has been discussed with our ENT group and they have recommended referral to [facility] for further evaluation.   We had long discussion regarding his wife who  is suffering from on going complications after a covid infection. I recommended they get a palliative care consult for her.   Follow up in 2 month.   Freda Jackson, MD [facility] Office: [phone]   Current Outpatient Medications:    allopurinol (ZYLOPRIM) 300 MG tablet, Take 300 mg by mouth daily., Disp: , Rfl:    amLODipine (NORVASC) 5 MG tablet, Take 5 mg by mouth daily., Disp: , Rfl:    atenolol (TENORMIN) 50 MG tablet, Take 50 mg by mouth daily., Disp: , Rfl:    cetirizine (ZYRTEC) 10 MG tablet, Take 10 mg by mouth daily as needed for allergies., Disp: , Rfl:    clopidogrel (PLAVIX) 75 MG tablet, Take 75 mg by mouth daily., Disp: , Rfl:    hydrochlorothiazide (MICROZIDE) 12.5 MG capsule, Take 12.5 mg by mouth daily., Disp: , Rfl:    HYDROcodone bit-homatropine (HYCODAN) 5-1.5 MG/5ML syrup, Take 5 mLs by mouth every 6 (six) hours as needed for cough., Disp: 240 mL, Rfl: 0   levothyroxine (SYNTHROID) 25 MCG tablet, Take 25 mcg by mouth daily before breakfast., Disp: , Rfl:    potassium chloride (KLOR-CON) 20 MEQ packet, Take 40 mEq by mouth daily., Disp: 30 packet, Rfl: 0   sertraline (ZOLOFT) 50 MG tablet, Take 50 mg by mouth daily., Disp: , Rfl:    pantoprazole (PROTONIX) 40 MG tablet, Take 1 tablet (40 mg total) by mouth 2 (two) times daily., Disp: 60 tablet, Rfl: 1

## 2022-08-02 ENCOUNTER — Telehealth: Payer: Self-pay

## 2022-08-02 DIAGNOSIS — R634 Abnormal weight loss: Secondary | ICD-10-CM | POA: Diagnosis not present

## 2022-08-02 DIAGNOSIS — Q394 Esophageal web: Secondary | ICD-10-CM | POA: Diagnosis not present

## 2022-08-02 DIAGNOSIS — Z8679 Personal history of other diseases of the circulatory system: Secondary | ICD-10-CM | POA: Diagnosis not present

## 2022-08-02 DIAGNOSIS — K317 Polyp of stomach and duodenum: Secondary | ICD-10-CM | POA: Diagnosis not present

## 2022-08-02 DIAGNOSIS — R1314 Dysphagia, pharyngoesophageal phase: Secondary | ICD-10-CM | POA: Diagnosis not present

## 2022-08-02 DIAGNOSIS — R490 Dysphonia: Secondary | ICD-10-CM | POA: Diagnosis not present

## 2022-08-02 DIAGNOSIS — J982 Interstitial emphysema: Secondary | ICD-10-CM | POA: Diagnosis not present

## 2022-08-02 NOTE — Telephone Encounter (Signed)
        Patient  visited [facility] on 07/27/2022  for Interstitial emphysema.   Telephone encounter attempt :  1st  A HIPAA compliant voice message was left requesting a return call.  Instructed patient to call back at [phone].   [place] Resource Care Guide   ??[email or web address]  ?? [phone]   Website: [email or web address]  [email or web address]

## 2022-08-05 ENCOUNTER — Telehealth: Payer: Self-pay

## 2022-08-05 ENCOUNTER — Telehealth: Payer: Self-pay | Admitting: Pulmonary Disease

## 2022-08-05 NOTE — Telephone Encounter (Signed)
        Patient  visited [facility] on 07/27/2022  for Interstitial emphysema.   Telephone encounter attempt :  2nd  A HIPAA compliant voice message was left requesting a return call.  Instructed patient to call back at [phone].   [place] Resource Care Guide   ??[email or web address]  ?? [phone]   Website: [email or web address]  [email or web address]

## 2022-08-06 ENCOUNTER — Telehealth: Payer: Self-pay

## 2022-08-06 NOTE — Telephone Encounter (Signed)
I'm a bit confused the last order we sent to ENT was in 09/2021

## 2022-08-06 NOTE — Telephone Encounter (Signed)
     Patient  visit on 07/27/2022  at [facility] was for Interstitial emphysema.  Have you been able to follow up with your primary care physician? Patient has an appointment scheduled.  The patient was or was not able to obtain any needed medicine or equipment. No medications prescribed.  Are there diet recommendations that you are having difficulty following? No  Patient expresses understanding of discharge instructions and education provided has no other needs at this time.    [place] Resource Care Guide   ??[email or web address]  ?? [phone]   Website: [email or web address]  [email or web address]

## 2022-08-06 NOTE — Telephone Encounter (Signed)
Called patient back this evening and he wanted to make sure that Dr Erin Fulling was okay with his ENT referral that was sent to [facility] and he states that next available appointment is Oct 29 2022. He was wanting to make sure that this is ok.   Please advise sir

## 2022-08-06 NOTE — Telephone Encounter (Signed)
I would like if he could get evaluated sooner. He has severe dysphagia and is losing weight so he needs to have swallow evaluation sooner than later per ENT recommendations. I have added Pietro Cassis, PA from ENT who is familiar with this case to see if they can help move up his appointment for evaluation at [facility].  Thanks, JD

## 2022-08-06 NOTE — Telephone Encounter (Signed)
Routed message to Pietro Cassis, NP to see if he assist with sooner appt. Also routed to [facility] pool to see if they could assist with sooner appt.

## 2022-08-09 ENCOUNTER — Encounter: Payer: Self-pay | Admitting: Pulmonary Disease

## 2022-08-13 NOTE — Telephone Encounter (Signed)
Dr. Erin Fulling, please advise on this. Do you think we should place a new order for pt to be referred to [facility] or what do you think we need to do?

## 2022-08-14 ENCOUNTER — Telehealth: Payer: Self-pay | Admitting: Pulmonary Disease

## 2022-08-14 DIAGNOSIS — R1310 Dysphagia, unspecified: Secondary | ICD-10-CM

## 2022-08-14 NOTE — Telephone Encounter (Signed)
Called [facility] twice at [phone]. I was on hold the first time for over 15 mins before the call ended. No one answered the 2nd call. Will attempt to call back later.

## 2022-08-14 NOTE — Telephone Encounter (Signed)
I spoke with Dr. Redmond Baseman of ENT. Concern for cervical osteophytes leading to dysphagia and dysphonia. Unlikely surgical candidate to remove osteophytes. Recommendation for PEG tube made due to further weight loss and concerns for nutrition status.   Patient is ok with referral to IR for PEG tube placement. We will place referral to dietician/nutrition for tube feeding orders.   Thanks, JD

## 2022-08-14 NOTE — Telephone Encounter (Signed)
Please contact our local ENT group which Pietro Cassis, NP is apart of. They made arrangements for him to be seen at [facility] based on the complexity of his case. Their office needs to push for him to be seen earlier at [place].  Thanks JD

## 2022-08-14 NOTE — Telephone Encounter (Signed)
Order has been placed.

## 2022-08-14 NOTE — Telephone Encounter (Signed)
Team,   Please call the ENT office as I have requested. This needs to be done over the phone so his referral that the ENT office placed can be fast tracked. I don't believe a new order will help.   Thanks, JD

## 2022-08-15 ENCOUNTER — Other Ambulatory Visit: Payer: Self-pay | Admitting: Pulmonary Disease

## 2022-08-15 DIAGNOSIS — R1310 Dysphagia, unspecified: Secondary | ICD-10-CM

## 2022-08-16 ENCOUNTER — Ambulatory Visit
Admission: RE | Admit: 2022-08-16 | Discharge: 2022-08-16 | Disposition: A | Discharge status: Home / Self Care | Payer: Medicare HMO | Source: Ambulatory Visit | Attending: Pulmonary Disease | Admitting: Pulmonary Disease

## 2022-08-16 DIAGNOSIS — Z431 Encounter for attention to gastrostomy: Secondary | ICD-10-CM | POA: Diagnosis not present

## 2022-08-16 DIAGNOSIS — R1310 Dysphagia, unspecified: Secondary | ICD-10-CM | POA: Diagnosis not present

## 2022-08-16 NOTE — Consult Note (Addendum)
Chief Complaint: Patient was seen in consultation today for gastrostomy tube placement at the request of Eunola B  Referring Physician(s): Dewald,Jonathan B  History of Present Illness: Thomas Bond is a 81 y.o. male with a history of advanced pulmonary fibrosis who underwent a CT of the neck on 07/27/2022 for shortness of breath and hoarseness that revealed subcutaneous emphysema in the right neck communicating with pneumomediastinum. CT of the chest later that day showed extensive pneumomediastinum and right chest wall emphysema.He was not hospitalized and has recovered at home. He has had some worsening dyspnea with exertion but is not currently on home oxygen. He is not in any pain. He can only walk about 50 feet before having to stop dyspnea and fatigue.  Mr. Pfefferle has lost about 40 pounds in weight over the last 2 years with less significant weight loss recently. He can drink fluids better than he can eat solid food but lately has been ingesting much more food, Boost supplements and smoothies after his wife entered a rehab hospital and he received more help at home from his son and daughter. He has difficulty swallowing pills and his daughter just got him a pill crusher to use at home. He did have an EGD in August by Dr. Alessandra Bevels who dilated a web at the level of the cricopharyngeus muscle. He had an esophagram in January that was limited due to a barium tablet becoming lodged in his left pyriform sinus. The esophagus showed mild distal tertiary contractions. He has also been evaluated by ENT at [facility]. He attributes some of his weight loss at home from stress taking care of his wife and not taking care of himself and his children have been instrumental in stressing that he needs to eat and drink more.  Past Medical History:  Diagnosis Date   Aortic atherosclerosis ([place])    GERD (gastroesophageal reflux disease)    Gout    Hypertension    Pulmonary fibrosis  ([place])    Sleep apnea 2013   Not CPAP   TIA     Past Surgical History:  Procedure Laterality Date   BIOPSY  04/16/2022   Procedure: BIOPSY;  Surgeon: Otis Brace, MD;  Location: [facility];  Service: Gastroenterology;;   COLONOSCOPY WITH PROPOFOL N/A 10/10/2014   Procedure: COLONOSCOPY WITH PROPOFOL;  Surgeon: Garlan Fair, MD;  Location: [facility];  Service: Endoscopy;  Laterality: N/A;   ESOPHAGOGASTRODUODENOSCOPY (EGD) WITH PROPOFOL N/A 04/16/2022   Procedure: ESOPHAGOGASTRODUODENOSCOPY (EGD) WITH PROPOFOL;  Surgeon: Otis Brace, MD;  Location: [facility];  Service: Gastroenterology;  Laterality: N/A;   HEMOSTASIS CLIP PLACEMENT  04/16/2022   Procedure: HEMOSTASIS CLIP PLACEMENT;  Surgeon: Otis Brace, MD;  Location: [facility];  Service: Gastroenterology;;   HERNIA REPAIR Bilateral 1984 and 1998   POLYPECTOMY  04/16/2022   Procedure: POLYPECTOMY;  Surgeon: Otis Brace, MD;  Location: [facility];  Service: Gastroenterology;;   Azzie Almas DILATION N/A 04/16/2022   Procedure: SAVORY DILATION;  Surgeon: Otis Brace, MD;  Location: [facility];  Service: Gastroenterology;  Laterality: N/A;    Allergies: Patient has no known allergies.  Medications: Prior to Admission medications   Medication Sig Start Date End Date Taking? Authorizing Provider  allopurinol (ZYLOPRIM) 300 MG tablet Take 300 mg by mouth daily.    [provider]  amLODipine (NORVASC) 5 MG tablet Take 5 mg by mouth daily.    [provider]  atenolol (TENORMIN) 50 MG tablet Take 50 mg by mouth daily.  [provider]  cetirizine (ZYRTEC) 10 MG tablet Take 10 mg by mouth daily as needed for allergies.    [provider]  clopidogrel (PLAVIX) 75 MG tablet Take 75 mg by mouth daily.    [provider]  hydrochlorothiazide (MICROZIDE) 12.5 MG capsule Take 12.5 mg by mouth daily.    [provider]  HYDROcodone bit-homatropine (HYCODAN)  5-1.5 MG/5ML syrup Take 5 mLs by mouth every 6 (six) hours as needed for cough. 07/27/22   Mannam, Hart Robinsons, MD  levothyroxine (SYNTHROID) 25 MCG tablet Take 25 mcg by mouth daily before breakfast.    [provider]  pantoprazole (PROTONIX) 40 MG tablet Take 1 tablet (40 mg total) by mouth 2 (two) times daily. 04/16/22 05/16/22  Otis Brace, MD  potassium chloride (KLOR-CON) 20 MEQ packet Take 40 mEq by mouth daily. 07/27/22   Drenda Freeze, MD  sertraline (ZOLOFT) 50 MG tablet Take 50 mg by mouth daily.    [provider]     Family History  Problem Relation Age of Onset   Alzheimer's disease Mother    Heart attack Brother 31   Stroke Neg Hx     Social History   Socioeconomic History   Marital status: Married    Spouse name: Not on file   Number of children: Not on file   Years of education: Not on file   Highest education level: Not on file  Occupational History   Not on file  Tobacco Use   Smoking status: Never   Smokeless tobacco: Never  Vaping Use   Vaping Use: Never used  Substance and Sexual Activity   Alcohol use: Yes    Comment: occasionally beer on weekends   Drug use: No   Sexual activity: Not on file  Other Topics Concern   Not on file  Social History Narrative   Lives with wife.  4 children over two marriages.  Retired Psychiatric nurse of Radio broadcast assistant Strain: Not on [employer] Insecurity: Not on file  Transportation Needs: Not on file  Physical Activity: Not on file  Stress: Not on file  Social Connections: Not on file     Review of Systems: A 12 point ROS discussed and pertinent positives are indicated in the HPI above.  All other systems are negative.  Review of Systems  Constitutional:  Positive for fatigue and unexpected weight change.  Respiratory:  Positive for shortness of breath.   Cardiovascular: Negative.   Gastrointestinal: Negative.   Genitourinary: Negative.   Musculoskeletal:  Negative.   Neurological: Negative.     Vital Signs: BP 118/71 (BP Location: Right Arm, Patient Position: Sitting, Cuff Size: Normal)   Pulse 72   Temp 98.1 F (36.7 C) (Oral)   SpO2 97%     Physical Exam Vitals reviewed.  Constitutional:      Appearance: He is ill-appearing. He is not diaphoretic.  Cardiovascular:     Rate and Rhythm: Regular rhythm.     Heart sounds: Murmur heard.     No friction rub. No gallop.     Comments: 3/6 SEM Pulmonary:     Breath sounds: Stridor present. Rhonchi and rales present.     Comments: Diffuse rales/rhonchi bilaterally. Some stridor/dysphonia when breathing and speaking. Abdominal:     General: Bowel sounds are normal. There is no distension.     Palpations: Abdomen is soft. There is no mass.     Tenderness: There is no abdominal  tenderness. There is no guarding or rebound.  Musculoskeletal:        General: No swelling.  Skin:    General: Skin is warm and dry.  Neurological:     General: No focal deficit present.     Mental Status: He is alert and oriented to person, place, and time.     Imaging: DG Chest 2 View  Result Date: 08/03/2022 CLINICAL DATA:  Follow-up pneumomediastinum EXAM: CHEST - 2 VIEW COMPARISON:  07/27/2022 FINDINGS: Heart size is normal. Mild, residual pneumomediastinum and subcutaneous emphysema about the right chest wall, improved compared to prior examination. Fibrotic interstitial opacity in keeping with prior CT findings, without acute appearing airspace opacity. The visualized skeletal structures are unremarkable. IMPRESSION: 1. Mild, residual pneumomediastinum and subcutaneous emphysema about the right chest wall, improved compared to prior examination. 2. Fibrotic interstitial opacity in keeping with prior CT findings, without acute appearing airspace opacity. Electronically Signed   By: Delanna Ahmadi M.D.   On: 08/03/2022 11:30   CT CHEST WO CONTRAST  Result Date: 07/27/2022 CLINICAL DATA:  Chronic  persistent cough, pneumomediastinum on neck CT EXAM: CT CHEST WITHOUT CONTRAST TECHNIQUE: Multidetector CT imaging of the chest was performed following the standard protocol without IV contrast. RADIATION DOSE REDUCTION: This exam was performed according to the departmental dose-optimization program which includes automated exposure control, adjustment of the mA and/or kV according to patient size and/or use of iterative reconstruction technique. COMPARISON:  07/27/2022, 06/22/2022 FINDINGS: Cardiovascular: Unenhanced imaging of the heart is unremarkable without pericardial effusion. Normal caliber of the thoracic aorta. Continued atherosclerosis of the aorta and coronary vasculature. Calcification of the aortic valve. Mediastinum/Nodes: Has partially visualized on earlier neck CT, there is diffuse pneumomediastinum. Subcutaneous gas is seen within the base of the neck and the right lateral chest wall/axilla. Thyroid, trachea, and esophagus are unremarkable. No pathologic adenopathy. Lungs/Pleura: Persistent subpleural scarring and fibrosis, with a peripheral and basilar predilection, unchanged since prior exam. Findings could reflect chronic interstitial lung disease such as UIP. No acute airspace disease, effusion, or pneumothorax. The central airways are patent. Upper Abdomen: No acute abnormality. Endoscopy clips again seen within the gastric lumen. Stable appearance of the left renal cyst, no further imaging follow-up is required. Musculoskeletal: No acute or destructive bony lesions. Reconstructed images demonstrate no additional findings. IMPRESSION: 1. Extensive pneumomediastinum, as well as subcutaneous gas within the base of the neck and right lateral chest wall. Given clinical presentation and known history of pulmonary scarring and fibrosis, this likely reflects sequela of barotrauma. 2. Persistent basilar predominant subpleural scarring and fibrosis, consistent with chronic interstitial lung disease  such as UIP. 3. Aortic Atherosclerosis (ICD10-I70.0). Coronary artery atherosclerosis. Electronically Signed   By: Randa Ngo M.D.   On: 07/27/2022 17:39   DG Chest Port 1 View  Result Date: 07/27/2022 CLINICAL DATA:  Shortness of breath.  Pneumomediastinum. EXAM: PORTABLE CHEST 1 VIEW COMPARISON:  CT chest 06/22/2022 and CT neck 07/27/2022 FINDINGS: Signs of pneumomediastinum identified. There is also soft tissue gas identified within the right lower neck and right axilla. Heart size and mediastinal contours are normal. No pleural effusion, interstitial edema or airspace disease. There is mild diffuse reticular interstitial opacities with a peripheral predominance corresponding to previously demonstrated chronic interstitial lung disease. No superimposed airspace consolidation. The visualized skeletal structures are unremarkable. IMPRESSION: 1. Signs of pneumomediastinum and soft tissue gas within the right lower neck and right axilla. No significant pneumothorax identified. Given the presence of chronic interstitial lung disease findings may  reflect sequelae of barotrauma. 2. Chronic interstitial lung disease. Electronically Signed   By: Kerby Moors M.D.   On: 07/27/2022 16:46   CT SOFT TISSUE NECK W CONTRAST  Addendum Date: 07/27/2022   ADDENDUM REPORT: 07/27/2022 11:05 ADDENDUM: Study discussed by telephone with Marshell Garfinkel, MD, on-call for Dr. Freda Jackson on 07/27/2022 at 1056 hours. Electronically Signed   By: Genevie Ann M.D.   On: 07/27/2022 11:05   Result Date: 07/27/2022 CLINICAL DATA:  81 year old male with shortness of breath and hoarseness. EXAM: CT NECK WITH CONTRAST TECHNIQUE: Multidetector CT imaging of the neck was performed using the standard protocol following the bolus administration of intravenous contrast. RADIATION DOSE REDUCTION: This exam was performed according to the departmental dose-optimization program which includes automated exposure control, adjustment of the  mA and/or kV according to patient size and/or use of iterative reconstruction technique. CONTRAST:  82m OMNIPAQUE IOHEXOL 300 MG/ML  SOLN COMPARISON:  CTA chest 06/22/2022. Brain MRI 05/22/2016. FINDINGS: Pharynx and larynx: Mild motion artifact. Subsequently, larynx and pharynx soft tissue contours are within normal limits. No abnormal mucosal space enhancement. Superior parapharyngeal and retropharyngeal spaces are normal. But there is right lower parapharyngeal space soft tissue gas which increases into the lower neck and at the thoracic inlet, see below. Salivary glands: Negative sublingual space and submandibular glands. Parotid glands are within normal limits. Thyroid: Negative except for surrounding deep soft tissue space gas tracking cephalad from the superior mediastinum, see below. Lymph nodes: Circumscribed dorsal midline soft tissue cyst inseparable from the dermis, likely sebaceous cyst or similar lesion. Mixed macroscopic fat and nodular soft tissue subcutaneous mass in the right neck submandibular space (series 2, image 54 and coronal image 48) up to 3 cm diameter with soft tissue nodularity in addition to macroscopic fat which is up to 1.6 cm short axis. No regional inflammation. This was not apparent on the 2017 MRI. No superimposed cervical lymphadenopathy. Vascular: Major arterial structures in the neck and at the skull base are patent despite atherosclerosis. Visible IJ bulbs, sigmoid and transverse venous sinuses are also patent. Limited intracranial: Negative. Visualized orbits: Negative. Mastoids and visualized paranasal sinuses: Clear throughout. Skeleton: Bulky cervical spine degeneration with Diffuse idiopathic skeletal hyperostosis (DISH). Associated cervical spinal stenosis, up to moderate at the C3-C4 and C6-C7 levels. Flowing anterior endplate osteophytes in the upper thoracic spine resulting in upper thoracic ankylosis. No acute or suspicious osseous lesion identified. Upper chest:  Moderate volume pneumomediastinum is visible and extends cephalad into the neck mostly on the right. Apical chronic lung disease with subpleural scarring, with chronic bilateral pulmonary interstitial disease better demonstrated on 06/22/2022. No convincing apical pneumothorax. No acute upper lung inflammation. No superimposed superior mediastinal fluid or lymphadenopathy. Partially visible Calcified aortic atherosclerosis. IMPRESSION: 1. Positive for Pneumomediastinum, tracking into the lower neck more so on the right. In the absence of penetrating trauma and considering chronic interstitial lung disease (CTA Chest 06/22/2022), favor this is Barotrauma related. Recommend follow-up Chest imaging. 2. Superficial 3 cm mixed fat and soft tissue mass in the right neck submandibular space is suspicious for a low-grade Liposarcoma and seems to be new since 2017. 3. No other acute or inflammatory process identified in the neck. Bulky cervical spine degeneration with Diffuse idiopathic skeletal hyperostosis (DISH) and up to moderate at the C3-C4 and C6-C7 levels. Aortic Atherosclerosis (ICD10-I70.0). Electronically Signed: By: HGenevie AnnM.D. On: 07/27/2022 09:58    Labs:  CBC: Recent Labs    06/22/22 1722 07/27/22 1552  WBC  10.7* 15.7*  HGB 12.4* 13.4  HCT 36.5* 40.2  PLT 223 291    COAGS: No results for input(s): "INR", "APTT" in the last 8760 hours.  BMP: Recent Labs    06/22/22 1722 07/23/22 1549 07/27/22 1552  NA 138 140 138  K 3.4* 3.6 2.8*  CL 96* 98 99  CO2 '28 29 28  '$ GLUCOSE 93 93 103*  BUN 20 37* 37*  CALCIUM 9.3 9.8 9.3  CREATININE 1.26* 1.21 1.21  GFRNONAA 57*  --  59*     Assessment and Plan:  Mr. Snider requested a discussion regarding possible gastrostomy tube placement and was accompanied by his daughter who is here from [place], [place]. I described details of percutaneous gastrostomy. Although feasible from an anatomic standpoint when reviewing his imaging, I told him  and his daughter that I am very concerned about administering moderate conscious sedation currently given his clinical condition, recent significant pneumomediastinum and advanced pulmonary fibrosis. I am quite concerned about his airway and lung disease during a procedure and he probably would require general anesthesia even for gastrostomy tube placement to protect his airway. Fortunately, his oral caloric ingestion has seemingly improved significantly at home in the last month since his wife went to a rehab hospital, and he and his daughter are hopeful that he will not need a gastrostomy tube. I recommended waiting to see if he can gain weight with this concentration on increasing caloric ingestion before committing to gastrostomy tube placement. A nutritional consult may also be helpful for advice and reinforcement for Mr. Rogerson and his family.  Thank you for this interesting consult.  I greatly enjoyed meeting DONTREAL MIERA and look forward to participating in their care.  A copy of this report was sent to the requesting provider on this date.  Electronically Signed: Azzie Roup 08/16/2022, 3:44 PM    I spent a total of 30 Minutes in face to face in clinical consultation, greater than 50% of which was counseling/coordinating care for possible gastrostomy.

## 2022-08-21 DIAGNOSIS — L814 Other melanin hyperpigmentation: Secondary | ICD-10-CM | POA: Diagnosis not present

## 2022-08-21 DIAGNOSIS — L821 Other seborrheic keratosis: Secondary | ICD-10-CM | POA: Diagnosis not present

## 2022-08-21 DIAGNOSIS — L720 Epidermal cyst: Secondary | ICD-10-CM | POA: Diagnosis not present

## 2022-08-21 DIAGNOSIS — Z85828 Personal history of other malignant neoplasm of skin: Secondary | ICD-10-CM | POA: Diagnosis not present

## 2022-08-21 DIAGNOSIS — Z08 Encounter for follow-up examination after completed treatment for malignant neoplasm: Secondary | ICD-10-CM | POA: Diagnosis not present

## 2022-08-21 DIAGNOSIS — D225 Melanocytic nevi of trunk: Secondary | ICD-10-CM | POA: Diagnosis not present

## 2022-08-21 DIAGNOSIS — L853 Xerosis cutis: Secondary | ICD-10-CM | POA: Diagnosis not present

## 2022-08-28 ENCOUNTER — Ambulatory Visit (HOSPITAL_COMMUNITY): Payer: Medicare HMO

## 2022-10-02 ENCOUNTER — Ambulatory Visit: Payer: Medicare HMO | Admitting: Pulmonary Disease

## 2022-10-02 ENCOUNTER — Encounter: Payer: Self-pay | Admitting: Pulmonary Disease

## 2022-10-02 VITALS — BP 118/70 | HR 58 | Ht 66.0 in | Wt 135.0 lb

## 2022-10-02 DIAGNOSIS — E119 Type 2 diabetes mellitus without complications: Secondary | ICD-10-CM | POA: Diagnosis not present

## 2022-10-02 DIAGNOSIS — J8410 Pulmonary fibrosis, unspecified: Secondary | ICD-10-CM

## 2022-10-02 NOTE — Progress Notes (Signed)
Synopsis: Referred in December 2022 for abnormal chest x-ray by Holland Commons, FNP  Subjective:   PATIENT ID: Thomas Bond GENDER: male DOB: [birth year], MRN: [id number]  HPI  Chief Complaint  Patient presents with   Follow-up    2 mo f/u. States his throat issues have slightly improved since last visit.    Thomas Bond is an 82 year old male, never smoker with GERD, hypertension, dysphagia and history of stroke who returns to pulmonary clinic for pulmonary fibrosis.   His wife passed away 2 weeks ago.   He met with IR about having a PEG tube placed with decision made to hold off at this time. His weight is stable compared to last visit.    OV 08/01/22 He had neck CT which showed evidence of pneumomediastinum and was sent to the ER for further evaluation with CT Chest. He was stable at that time and discharged home with close follow.   I discussed patient's case with ENT yesterday and there is concern that the cervical osteophytes could be leading to extrinsic compression on his upper airway with dysphagia and dysphonia. They recommended referral to [facility] main campus for further swallowing evaluation and testing.   He has not had progression of symptoms regarding pneumomediastinum. He continues to have difficulty swallowing. His son is in attendance with him today.   OV 07/23/22 He was seen by Dr. Ander Slade in acute visit 07/18/22 where he was treated with prednisone and azithromycin for dyspnea, wheezing and cough. He reports the dyspne and wheezing are better. He reports his dysphagia is progressive. He has lost 9lbs since 11/2 due to difficulty eating. He reports progressive changes in his voice that is muffled. Ofev was stopped at last visit in August.  His wife has been in the hospital recovering from covid infection complicated by bacterial pneumonia.   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 Evidence of gastritis s/p biopsies. He denies any significant changes in his ability to swallow or change in his voice.   He has been taking ofev 150 1 tab daily over the past month. He cut back from twice daily. He continues to have queasy stomach and diarrhea. His weight is stable from last visit. He goes back to bed one to two times per day which is new for him.   OV 02/12/22 He was seen by ENT on 01/16/22, note reviewed. No significant findings on flexible laryngoscopy to explain his hoarseness of voice.   He saw Speech Therapy 01/01/22 who recommended updated MBS and ongoing therapy for pharyngeal strengthening.   He started on ofev '150mg'$  1 tab twice daily since last visit. He has lost 8lbs since last visit. He has lost 14lbs since the beginning of the year. Since starting ofev he has decreased appetite and is having loose stools 2-3 times per day.   OV 12/21/21 He met with our pharmacy team on 10/17/21 and has not received Esbriet yet.  He had modified barium swallow as well as esophagram performed in January 2023 where he was noted to have the barium tablet lodged in the left piriform sinus.  He continues to have hoarse voice and trouble swallowing.  He is scheduled to see ENT in May.  Pulmonary function test 10/12/2021 showed mixed obstructive and restrictive defects along with significant bronchodilator response and moderate diffusion defect.  I reviewed his HRCT chest scan findings and pulmonary function  test findings with the patient and his wife.  We had detailed discussion about the progression of pulmonary fibrosis and thinking about their advanced directives.  OV 10/12/21 HRCT Chest 08/24/21 shows evidence of usual interstitial pneumonia with widespread areas of ground glass atenuation, septal thickening, subpleural reticulation, traction bronchiectasis and peripheral bronchiolectasis with very mild honeycombing in the lung bases.   PFTs today show mild restriction with moderate  diffusion defect.   He did not notice benefit from [facility] ellipta inhaler.   He continues to have hoarseness of voice which is new over recent months. MBS from 2019 noted from office visit 08/17/21 with anterior cervical spine intrusion of pharynx/esophagus.  OV 08/17/21 Patient reports having shortness of breath over the past couple of years.  He has been tried on Asmanex as needed which he did not notice much improvement in his symptoms.  He has issues with dysphagia and has been evaluated by speech therapy multiple times over the past years.  Modified barium swallow from 2019 which shows mild pharyngeal cervical esophageal dysphagia due to mechanical obstruction from anterior cervical spine.  No aspiration observed and trace laryngeal penetration of thin liquids from vallecular residuals spilling into open airway cleared with further swallow.  Epiglottic deflection decreased likely due to cervical spine protrusion into pharynx which contributes to pharyngeal residuals.  He denies any issues with frequent pneumonias over the past years.  He denies any cough but does report wheezing.  He does report hoarseness in his voice which has been more constant over recent months.  He denies any seasonal allergies.  He denies any sinus congestion or postnasal drainage.  He is a never smoker but is exposed to significant secondhand smoke growing up as his parents smoked in the household.  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  He complains that his feet feel swollen in the morning time but when he checks them they do not appear swollen.  Past Medical History:  Diagnosis Date   Aortic atherosclerosis (HCC)    GERD (gastroesophageal reflux disease)    Gout    Hypertension    Pulmonary fibrosis (HCC)    Sleep apnea 2013   Not CPAP   TIA      Family History  Problem Relation Age of Onset   Alzheimer's disease Mother    Heart attack Brother  42   Stroke Neg Hx      Social History   Socioeconomic History   Marital status: Married    Spouse name: Not on file   Number of children: Not on file   Years of education: Not on file   Highest education level: Not on file  Occupational History   Not on file  Tobacco Use   Smoking status: Never   Smokeless tobacco: Never  Vaping Use   Vaping Use: Never used  Substance and Sexual Activity   Alcohol use: Yes    Comment: occasionally beer on weekends   Drug use: No   Sexual activity: Not on file  Other Topics Concern   Not on file  Social History Narrative   Lives with wife.  4 children over two marriages.  Retired Psychiatric nurse of Radio broadcast assistant Strain: Not on [employer] Insecurity: Not on file  Transportation Needs: Not on file  Physical Activity: Not on file  Stress: Not on file  Social Connections: Not on file  Intimate Partner Violence:  Not on file     No Known Allergies   Outpatient Medications Prior to Visit  Medication Sig Dispense Refill   allopurinol (ZYLOPRIM) 300 MG tablet Take 300 mg by mouth daily.     amLODipine (NORVASC) 5 MG tablet Take 5 mg by mouth daily.     atenolol (TENORMIN) 50 MG tablet Take 50 mg by mouth daily.     cetirizine (ZYRTEC) 10 MG tablet Take 10 mg by mouth daily as needed for allergies.     clopidogrel (PLAVIX) 75 MG tablet Take 75 mg by mouth daily.     hydrochlorothiazide (MICROZIDE) 12.5 MG capsule Take 12.5 mg by mouth daily.     HYDROcodone bit-homatropine (HYCODAN) 5-1.5 MG/5ML syrup Take 5 mLs by mouth every 6 (six) hours as needed for cough. 240 mL 0   levothyroxine (SYNTHROID) 25 MCG tablet Take 25 mcg by mouth daily before breakfast.     potassium chloride (KLOR-CON) 20 MEQ packet Take 40 mEq by mouth daily. 30 packet 0   sertraline (ZOLOFT) 50 MG tablet Take 50 mg by mouth daily.     pantoprazole (PROTONIX) 40 MG tablet Take 1 tablet (40 mg total) by mouth 2 (two) times daily. 60 tablet  1   No facility-administered medications prior to visit.   Review of Systems  Constitutional:  Negative for chills, fever, malaise/fatigue and weight loss.  HENT:  Negative for congestion, sinus pain and sore throat.        Difficulty swallowing  Eyes: Negative.   Respiratory:  Positive for cough and shortness of breath. Negative for hemoptysis, sputum production and wheezing.   Cardiovascular:  Negative for chest pain, palpitations, orthopnea, claudication and leg swelling.  Gastrointestinal:  Negative for abdominal pain, heartburn, nausea and vomiting.       Dysphagia  Genitourinary: Negative.   Musculoskeletal:  Negative for joint pain and myalgias.  Skin:  Negative for rash.  Neurological:  Negative for weakness.  Endo/Heme/Allergies: Negative.   Psychiatric/Behavioral: Negative.      Objective:   Vitals:   10/02/22 1035  BP: 118/70  Pulse: (!) 58  SpO2: 99%  Weight: 135 lb (61.2 kg)  Height: '5\' 6"'$  (1.676 m)    Physical Exam Constitutional:      General: He is not in acute distress. HENT:     Head: Normocephalic and atraumatic.     Salivary Glands: Right salivary gland is not diffusely enlarged. Left salivary gland is not diffusely enlarged.     Mouth/Throat:     Mouth: No oral lesions.     Palate: No mass and lesions.     Tonsils: No tonsillar exudate.     Comments: Hoarse voice Eyes:     Conjunctiva/sclera: Conjunctivae normal.  Neck:     Comments: Soft tissue mass along right neck, mobile Cardiovascular:     Rate and Rhythm: Normal rate and regular rhythm.     Pulses: Normal pulses.     Heart sounds: Normal heart sounds. No murmur heard. Pulmonary:     Breath sounds: Rales (Bibasilar) present.  Musculoskeletal:     Right lower leg: No edema.     Left lower leg: No edema.  Skin:    General: Skin is warm and dry.  Neurological:     General: No focal deficit present.     Mental Status: He is alert.    CBC    Component Value Date/Time   WBC 15.7  (H) 07/27/2022 1552   RBC 4.29 07/27/2022 1552  HGB 13.4 07/27/2022 1552   HCT 40.2 07/27/2022 1552   PLT 291 07/27/2022 1552   MCV 93.7 07/27/2022 1552   MCH 31.2 07/27/2022 1552   MCHC 33.3 07/27/2022 1552   RDW 13.3 07/27/2022 1552   LYMPHSABS 1.7 07/27/2022 1552   MONOABS 1.5 (H) 07/27/2022 1552   EOSABS 0.1 07/27/2022 1552   BASOSABS 0.0 07/27/2022 1552      Latest Ref Rng & Units 07/27/2022    3:52 PM 07/23/2022    3:49 PM 06/22/2022    5:22 PM  BMP  Glucose 70 - 99 mg/dL 103  93  93   BUN 8 - 23 mg/dL 37  37  20   Creatinine 0.61 - 1.24 mg/dL 1.21  1.21  1.26   Sodium 135 - 145 mmol/L 138  140  138   Potassium 3.5 - 5.1 mmol/L 2.8  3.6  3.4   Chloride 98 - 111 mmol/L 99  98  96   CO2 22 - 32 mmol/L '28  29  28   '$ Calcium 8.9 - 10.3 mg/dL 9.3  9.8  9.3    Chest imaging: CXR 08/01/22 1. Mild, residual pneumomediastinum and subcutaneous emphysema about the right chest wall, improved compared to prior examination. 2. Fibrotic interstitial opacity in keeping with prior CT findings, without acute appearing airspace opacity.  CT Chest PE Study 06/22/22 Cardiovascular: There are no intraluminal filling defects in pulmonary artery branches. Contrast density in thoracic aorta is less than adequate to evaluate the lumen. Calcifications are seen in aortic annulus. There is ectasia of ascending thoracic aorta measuring 3.9 cm. Coronary artery calcifications are seen. Heart is enlarged in size.   Mediastinum/Nodes: Subcentimeter nodes are seen in mediastinum. Thyroid is not distinctly visualized.   Lungs/Pleura: Increased interstitial markings are seen with subpleural honeycombing in both lungs suggesting chronic interstitial lung disease, possibly UIP. There is no focal pulmonary consolidation. There is no pleural effusion or pneumothorax.   Upper Abdomen: There is 12 cm cyst in the upper pole of left kidney.  HRCT Chest 08/24/21 1. The appearance of the lungs is  considered diagnostic of usual interstitial pneumonia (UIP) per current ATS guidelines. 2. Aortic atherosclerosis, in addition to left main and 3 vessel coronary artery disease. 3. There are calcifications of the aortic valve. Echocardiographic correlation for evaluation of potential valvular dysfunction may be warranted if clinically indicated. 4. Morphologic changes in the liver, suggestive of underlying cirrhosis.  PFT:    Latest Ref Rng & Units 10/12/2021   12:51 PM  PFT Results  FVC-Pre L 2.35   FVC-Predicted Pre % 73   FVC-Post L 2.71   FVC-Predicted Post % 84   Pre FEV1/FVC % % 47   Post FEV1/FCV % % 73   FEV1-Pre L 1.11   FEV1-Predicted Pre % 49   FEV1-Post L 1.97   DLCO uncorrected ml/min/mmHg 11.35   DLCO UNC% % 54   DLCO corrected ml/min/mmHg 11.35   DLCO COR %Predicted % 54   DLVA Predicted % 69   TLC L 4.38   TLC % Predicted % 71   RV % Predicted % 80   2023: Mild restriction, moderate diffusion defect  Labs:  Path:  Echo:  Heart Catheterization:  Assessment & Plan:   Pulmonary fibrosis (HCC)  Discussion: Thomas Bond is an 82 year old male, never smoker with GERD, hypertension, dysphagia and history of stroke who returns to pulmonary clinic for pulmonary fibrosis.   He was tried on ofev last year  which was discontinued due to weight loss, fatigue and weakness along with diarrhea. He has ongoing dysphagia issues due to concern of cervical osteophytes by ENT evaluation. He met with IR regarding a PEG tube and he decided against having it placed at this time.   His weight is stable since last visit. He lost his wife 2 weeks ago.   We will continue to monitor his respiratory symptoms at this time. No further plans to try anti fibrotic medications given his weight issues at this time.   Follow up in 6 months.   Freda Jackson, MD [facility] Office: [phone]   Current Outpatient Medications:    allopurinol  (ZYLOPRIM) 300 MG tablet, Take 300 mg by mouth daily., Disp: , Rfl:    amLODipine (NORVASC) 5 MG tablet, Take 5 mg by mouth daily., Disp: , Rfl:    atenolol (TENORMIN) 50 MG tablet, Take 50 mg by mouth daily., Disp: , Rfl:    cetirizine (ZYRTEC) 10 MG tablet, Take 10 mg by mouth daily as needed for allergies., Disp: , Rfl:    clopidogrel (PLAVIX) 75 MG tablet, Take 75 mg by mouth daily., Disp: , Rfl:    hydrochlorothiazide (MICROZIDE) 12.5 MG capsule, Take 12.5 mg by mouth daily., Disp: , Rfl:    HYDROcodone bit-homatropine (HYCODAN) 5-1.5 MG/5ML syrup, Take 5 mLs by mouth every 6 (six) hours as needed for cough., Disp: 240 mL, Rfl: 0   levothyroxine (SYNTHROID) 25 MCG tablet, Take 25 mcg by mouth daily before breakfast., Disp: , Rfl:    potassium chloride (KLOR-CON) 20 MEQ packet, Take 40 mEq by mouth daily., Disp: 30 packet, Rfl: 0   sertraline (ZOLOFT) 50 MG tablet, Take 50 mg by mouth daily., Disp: , Rfl:    pantoprazole (PROTONIX) 40 MG tablet, Take 1 tablet (40 mg total) by mouth 2 (two) times daily., Disp: 60 tablet, Rfl: 1

## 2022-10-02 NOTE — Patient Instructions (Signed)
We will continue to monitor your pulmonary fibrosis  Follow up in 6 months

## 2022-10-06 ENCOUNTER — Encounter: Payer: Self-pay | Admitting: Pulmonary Disease

## 2022-11-13 DIAGNOSIS — H6123 Impacted cerumen, bilateral: Secondary | ICD-10-CM | POA: Diagnosis not present

## 2022-11-18 DIAGNOSIS — H903 Sensorineural hearing loss, bilateral: Secondary | ICD-10-CM | POA: Diagnosis not present

## 2022-11-25 IMAGING — CT CT ABD-PELV W/ CM
1 of 3 series · 12 of 32 positions shown, 18 images · IV contrast (APPLIED)
Comparison: CT abdomen pelvis dated 10/06/2013.

CLINICAL DATA: Weight loss. History of bilateral inguinal hernia
the upper.

EXAM:
CT ABDOMEN AND PELVIS WITH CONTRAST
TECHNIQUE: Multidetector CT imaging of the abdomen and pelvis was performed
using the standard protocol following bolus administration of
intravenous contrast.

[Series 2: abd/pelvis w/cm · axial · 0.79mm/px · z∈[-390,+25]mm · 12 of 99 slices shown, 18 images]
[im 8/99  soft-tissue]
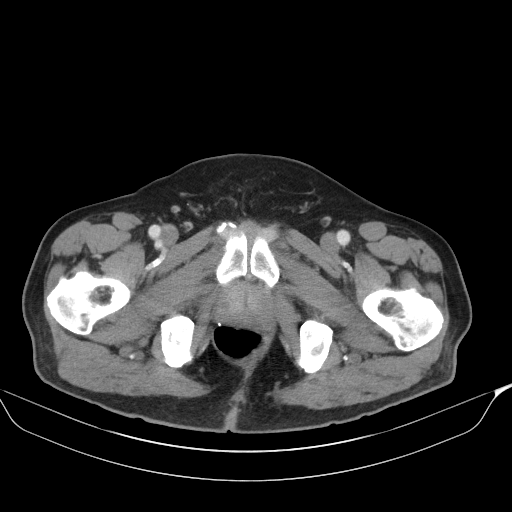
[im 8/99  bone]
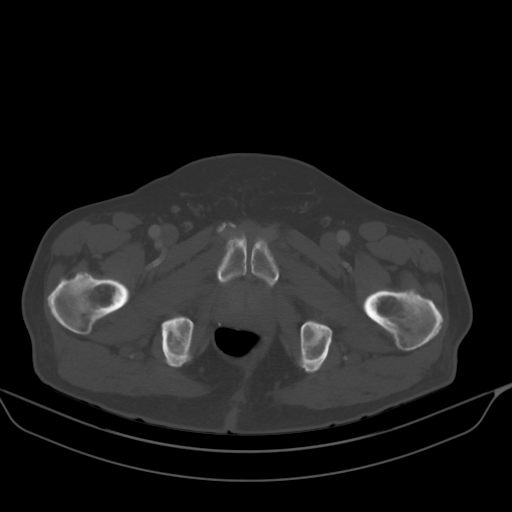
[im 16/99  soft-tissue]
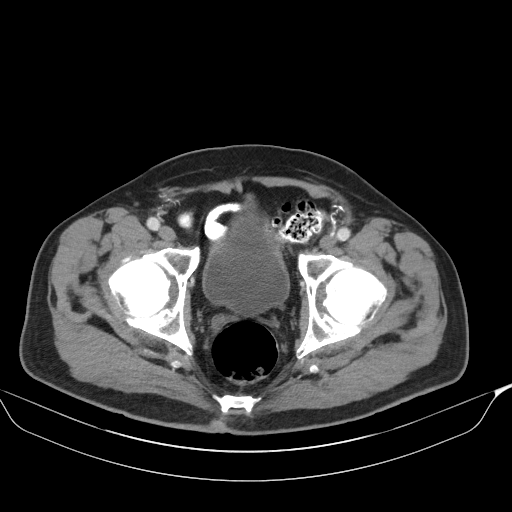
[im 23/99  soft-tissue]
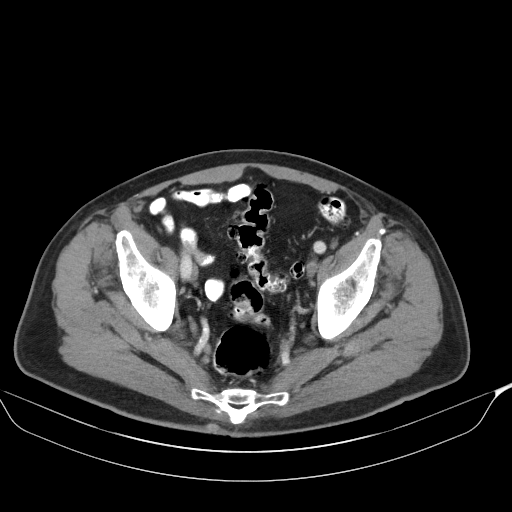
[im 31/99  soft-tissue]
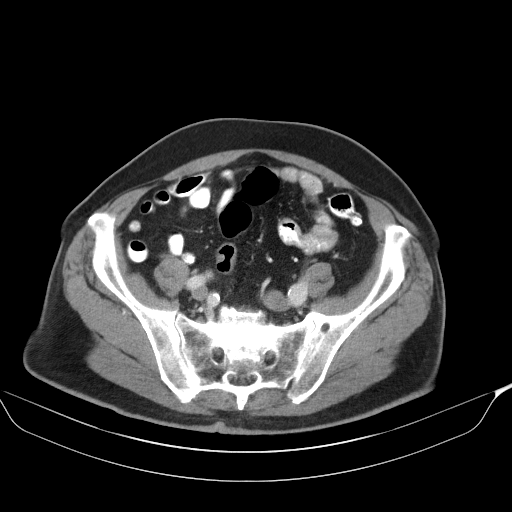
[im 38/99  soft-tissue]
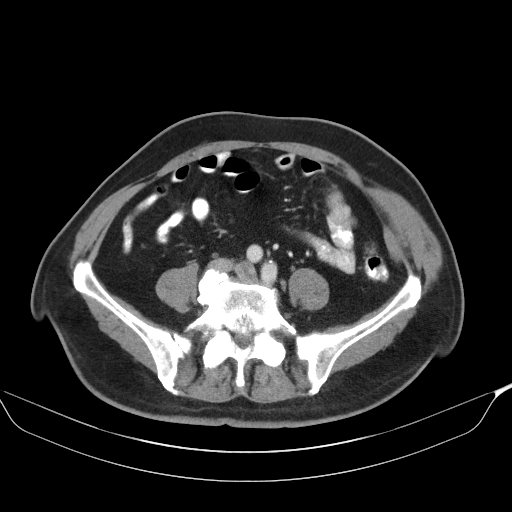
[im 46/99  soft-tissue]
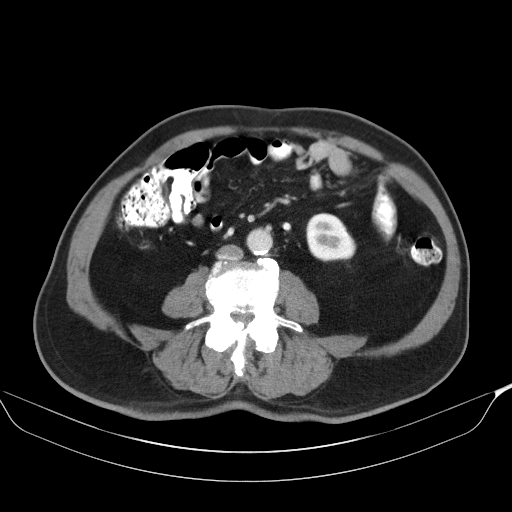
[im 53/99  soft-tissue]
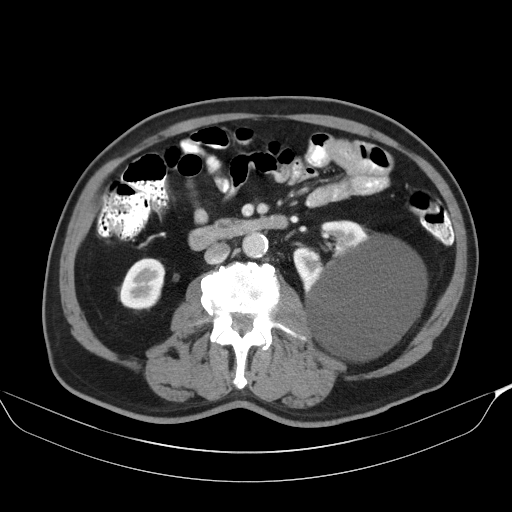
[im 61/99  soft-tissue]
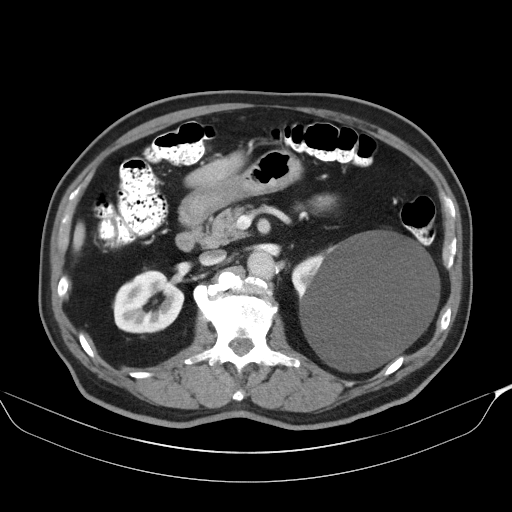
[im 68/99  soft-tissue]
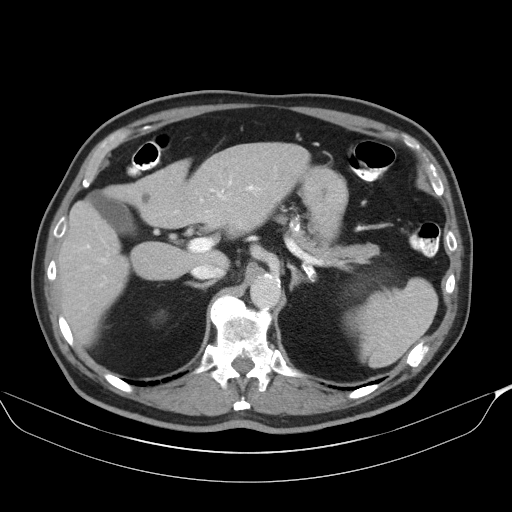
[im 68/99  lung]
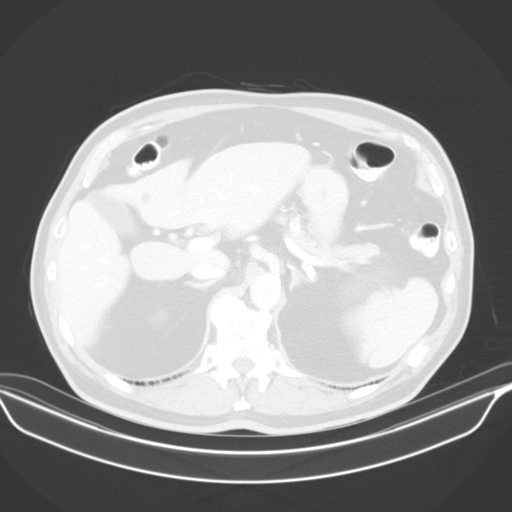
[im 68/99  bone]
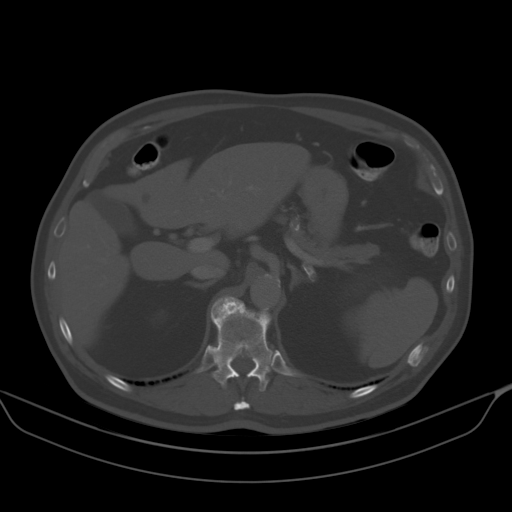
[im 76/99  soft-tissue]
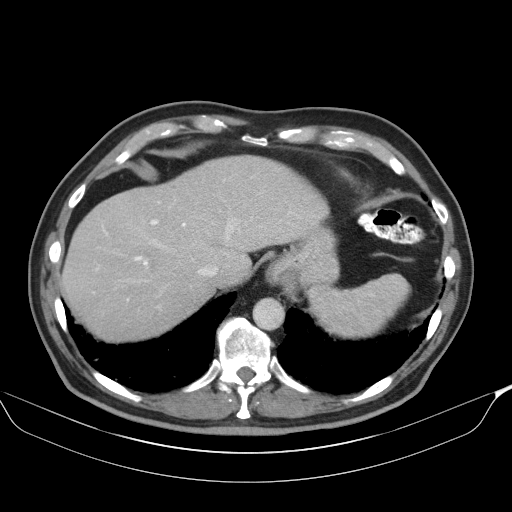
[im 76/99  lung]
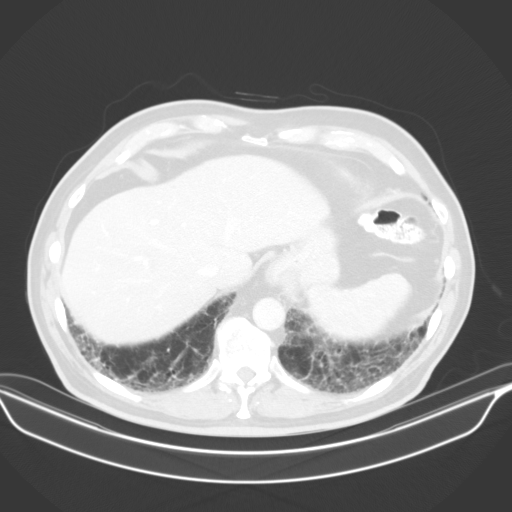
[im 83/99  soft-tissue]
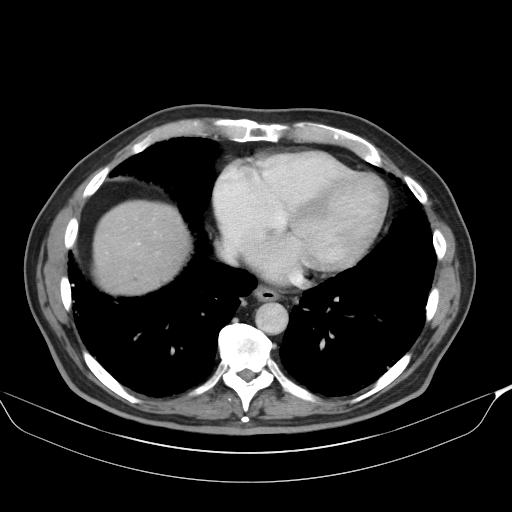
[im 83/99  lung]
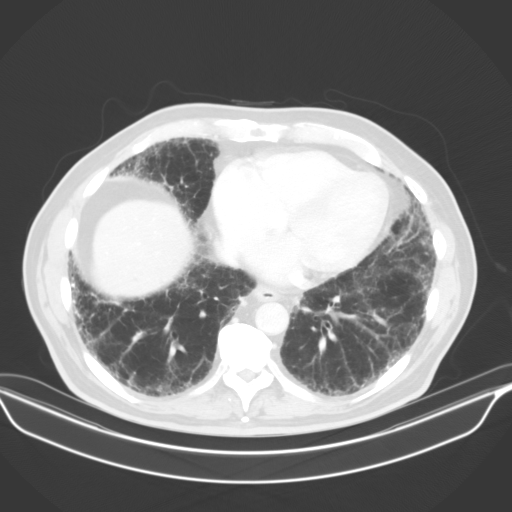
[im 91/99  soft-tissue]
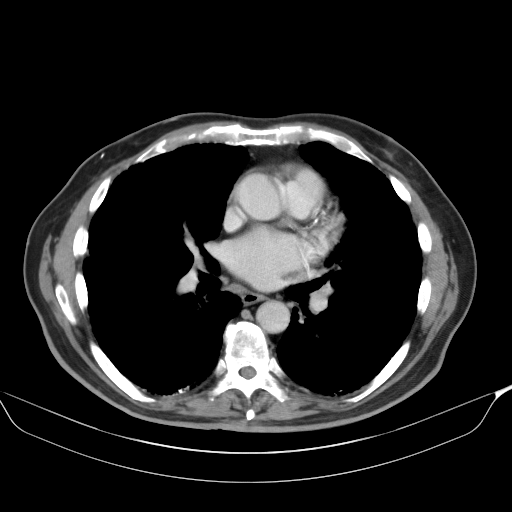
[im 91/99  lung]
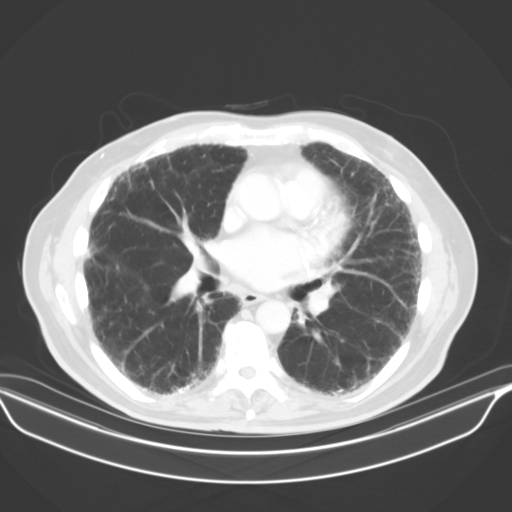

[12 of 32 positions shown; findings below may reference images not displayed]

RADIATION DOSE REDUCTION: This exam was performed according to the
departmental dose-optimization program which includes automated
exposure control, adjustment of the mA and/or kV according to
patient size and/or use of iterative reconstruction technique.

CONTRAST:  100mL [id number] IOPAMIDOL ([id number]) INJECTION 61%
FINDINGS: Lower chest: Diffuse subpleural reticulation and scarring cough
representing fibrosis and interstitial lung disease. The visualized
lung bases are otherwise clear. There is coronary vascular
calcification.

No intra-abdominal free air or free fluid.

Hepatobiliary: Several subcentimeter hepatic hypodense lesions are
too small to characterize. No intrahepatic biliary dilatation. The
gallbladder is unremarkable.

Pancreas: Unremarkable. No pancreatic ductal dilatation or
surrounding inflammatory changes.

Spleen: Normal in size without focal abnormality.

Adrenals/Urinary Tract: The adrenal glands are unremarkable. Large
left renal upper pole cyst measures up to 12 cm. This was present
dating back to 1807. There is no hydronephrosis on either side.
There is symmetric enhancement and excretion of contrast by both
kidneys. The visualized ureters and urinary bladder appear
unremarkable.

Stomach/Bowel: There is sigmoid diverticulosis without active
inflammatory changes. There is no bowel obstruction or active
inflammation. The appendix is normal.

Vascular/Lymphatic: Moderate aortoiliac atherosclerotic disease. The
IVC is unremarkable. No portal venous gas. There is no adenopathy.

Reproductive: The prostate and seminal vesicles are grossly
unremarkable. No mass.

Other: None

Musculoskeletal: Degenerative changes of the spine. No acute osseous
pathology.
IMPRESSION: 1. No acute intra-abdominal or pelvic pathology.
2. Sigmoid diverticulosis. No bowel obstruction. Normal appendix.
3. Large left renal upper pole cyst.
4. Aortic Atherosclerosis ([id number].L).

## 2023-01-30 DIAGNOSIS — Z125 Encounter for screening for malignant neoplasm of prostate: Secondary | ICD-10-CM | POA: Diagnosis not present

## 2023-01-30 DIAGNOSIS — I10 Essential (primary) hypertension: Secondary | ICD-10-CM | POA: Diagnosis not present

## 2023-01-30 DIAGNOSIS — R7309 Other abnormal glucose: Secondary | ICD-10-CM | POA: Diagnosis not present

## 2023-01-30 DIAGNOSIS — E039 Hypothyroidism, unspecified: Secondary | ICD-10-CM | POA: Diagnosis not present

## 2023-01-30 DIAGNOSIS — E7800 Pure hypercholesterolemia, unspecified: Secondary | ICD-10-CM | POA: Diagnosis not present

## 2023-01-31 LAB — LAB REPORT - SCANNED
A1c: 5.8
EGFR: 59

## 2023-02-04 DIAGNOSIS — I6350 Cerebral infarction due to unspecified occlusion or stenosis of unspecified cerebral artery: Secondary | ICD-10-CM | POA: Diagnosis not present

## 2023-02-04 DIAGNOSIS — J8410 Pulmonary fibrosis, unspecified: Secondary | ICD-10-CM | POA: Diagnosis not present

## 2023-02-04 DIAGNOSIS — F331 Major depressive disorder, recurrent, moderate: Secondary | ICD-10-CM | POA: Diagnosis not present

## 2023-02-04 DIAGNOSIS — I10 Essential (primary) hypertension: Secondary | ICD-10-CM | POA: Diagnosis not present

## 2023-02-04 DIAGNOSIS — E039 Hypothyroidism, unspecified: Secondary | ICD-10-CM | POA: Diagnosis not present

## 2023-02-04 DIAGNOSIS — Z0000 Encounter for general adult medical examination without abnormal findings: Secondary | ICD-10-CM | POA: Diagnosis not present

## 2023-02-04 DIAGNOSIS — R7309 Other abnormal glucose: Secondary | ICD-10-CM | POA: Diagnosis not present

## 2023-02-04 DIAGNOSIS — I2510 Atherosclerotic heart disease of native coronary artery without angina pectoris: Secondary | ICD-10-CM | POA: Diagnosis not present

## 2023-02-04 DIAGNOSIS — E7800 Pure hypercholesterolemia, unspecified: Secondary | ICD-10-CM | POA: Diagnosis not present

## 2023-02-12 DIAGNOSIS — L0211 Cutaneous abscess of neck: Secondary | ICD-10-CM | POA: Diagnosis not present

## 2023-02-12 DIAGNOSIS — L304 Erythema intertrigo: Secondary | ICD-10-CM | POA: Diagnosis not present

## 2023-02-12 DIAGNOSIS — L0213 Carbuncle of neck: Secondary | ICD-10-CM | POA: Diagnosis not present

## 2023-03-11 DIAGNOSIS — L0213 Carbuncle of neck: Secondary | ICD-10-CM | POA: Diagnosis not present

## 2023-03-11 DIAGNOSIS — J342 Deviated nasal septum: Secondary | ICD-10-CM | POA: Diagnosis not present

## 2023-03-11 DIAGNOSIS — R221 Localized swelling, mass and lump, neck: Secondary | ICD-10-CM | POA: Diagnosis not present

## 2023-04-29 ENCOUNTER — Encounter: Payer: Self-pay | Admitting: Pulmonary Disease

## 2023-04-29 ENCOUNTER — Ambulatory Visit: Payer: Medicare HMO | Admitting: Pulmonary Disease

## 2023-04-29 VITALS — BP 134/76 | HR 56 | Temp 97.3°F | Ht 61.0 in | Wt 153.0 lb

## 2023-04-29 DIAGNOSIS — R490 Dysphonia: Secondary | ICD-10-CM

## 2023-04-29 DIAGNOSIS — R1310 Dysphagia, unspecified: Secondary | ICD-10-CM | POA: Diagnosis not present

## 2023-04-29 DIAGNOSIS — J8410 Pulmonary fibrosis, unspecified: Secondary | ICD-10-CM

## 2023-04-29 NOTE — Patient Instructions (Signed)
We will continue to monitor your respiratory symptoms at this time  Follow up in 6 months

## 2023-04-29 NOTE — Progress Notes (Signed)
Synopsis: Referred in December 2022 for abnormal chest x-ray by Fatima Sanger, FNP  Subjective:   PATIENT ID: Thomas Bond GENDER: male DOB: 1940-11-12, MRN: [id number]  HPI  Chief Complaint  Patient presents with   Follow-up    Breathing has not changed    Thomas Bond is an 82 year old male, never smoker with GERD, hypertension, dysphagia and history of stroke who returns to pulmonary clinic for pulmonary fibrosis.   He was seen 03/11/23 by Dr. Pollyann Kennedy of ENT for his neck masses/cysts. No plans for procedure given risk of general anesthesia needed for removal.  He had drainage of one of these neck masses/cysts due to concern for infection.  He has gained 18lbs since last visit.   No changes in his breathing.   OV 10-25-22 His wife passed away 2 weeks ago.   He met with IR about having a PEG tube placed with decision made to hold off at this time. His weight is stable compared to last visit.    OV 08/01/22 He had neck CT which showed evidence of pneumomediastinum and was sent to the ER for further evaluation with CT Chest. He was stable at that time and discharged home with close follow.   I discussed patient's case with ENT yesterday and there is concern that the cervical osteophytes could be leading to extrinsic compression on his upper airway with dysphagia and dysphonia. They recommended referral to [facility] main campus for further swallowing evaluation and testing.   He has not had progression of symptoms regarding pneumomediastinum. He continues to have difficulty swallowing. His son is in attendance with him today.   OV 07/23/22 He was seen by Dr. Wynona Neat in acute visit 07/18/22 where he was treated with prednisone and azithromycin for dyspnea, wheezing and cough. He reports the dyspne and wheezing are better. He reports his dysphagia is progressive. He has lost 9lbs since 11/2 due to difficulty eating. He reports progressive changes in his voice that is  muffled. Ofev was stopped at last visit in August.  His wife has been in the hospital recovering from covid infection complicated by bacterial pneumonia.   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 Evidence of gastritis s/p biopsies. He denies any significant changes in his ability to swallow or change in his voice.   He has been taking ofev 150 1 tab daily over the past month. He cut back from twice daily. He continues to have queasy stomach and diarrhea. His weight is stable from last visit. He goes back to bed one to two times per day which is new for him.   OV 02/12/22 He was seen by ENT on 01/16/22, note reviewed. No significant findings on flexible laryngoscopy to explain his hoarseness of voice.   He saw Speech Therapy 01/01/22 who recommended updated MBS and ongoing therapy for pharyngeal strengthening.   He started on ofev 150mg  1 tab twice daily since last visit. He has lost 8lbs since last visit. He has lost 14lbs since the beginning of the year. Since starting ofev he has decreased appetite and is having loose stools 2-3 times per day.   OV 12/21/21 He met with our pharmacy team on 10/17/21 and has not received Esbriet yet.  He had modified barium swallow as well as esophagram performed in January 2023 where he was noted to have the barium tablet lodged in the left piriform sinus.  He continues to have hoarse voice and trouble swallowing.  He is scheduled to see ENT in May.  Pulmonary function test 10/12/2021 showed mixed obstructive and restrictive defects along with significant bronchodilator response and moderate diffusion defect.  I reviewed his HRCT chest scan findings and pulmonary function test findings with the patient and his wife.  We had detailed discussion about the progression of pulmonary fibrosis and thinking about their advanced directives.  OV 10/12/21 HRCT Chest 08/24/21 shows evidence of usual  interstitial pneumonia with widespread areas of ground glass atenuation, septal thickening, subpleural reticulation, traction bronchiectasis and peripheral bronchiolectasis with very mild honeycombing in the lung bases.   PFTs today show mild restriction with moderate diffusion defect.   He did not notice benefit from [facility] ellipta inhaler.   He continues to have hoarseness of voice which is new over recent months. MBS from 2019 noted from office visit 08/17/21 with anterior cervical spine intrusion of pharynx/esophagus.  OV 08/17/21 Patient reports having shortness of breath over the past couple of years.  He has been tried on Asmanex as needed which he did not notice much improvement in his symptoms.  He has issues with dysphagia and has been evaluated by speech therapy multiple times over the past years.  Modified barium swallow from 2019 which shows mild pharyngeal cervical esophageal dysphagia due to mechanical obstruction from anterior cervical spine.  No aspiration observed and trace laryngeal penetration of thin liquids from vallecular residuals spilling into open airway cleared with further swallow.  Epiglottic deflection decreased likely due to cervical spine protrusion into pharynx which contributes to pharyngeal residuals.  He denies any issues with frequent pneumonias over the past years.  He denies any cough but does report wheezing.  He does report hoarseness in his voice which has been more constant over recent months.  He denies any seasonal allergies.  He denies any sinus congestion or postnasal drainage.  He is a never smoker but is exposed to significant secondhand smoke growing up as his parents smoked in the household.  He worked for a Chiropractor as an Airline pilot and denies any significant exposure to the chemicals.  He complains that his feet feel swollen in the morning time but when he checks them they do not appear swollen.  Past Medical History:  Diagnosis Date    Aortic atherosclerosis (HCC)    GERD (gastroesophageal reflux disease)    Gout    Hypertension    Pulmonary fibrosis (HCC)    Sleep apnea 2013   Not CPAP   TIA      Family History  Problem Relation Age of Onset   Alzheimer's disease Mother    Heart attack Brother 101   Stroke Neg Hx      Social History   Socioeconomic History   Marital status: Married    Spouse name: Not on file   Number of children: Not on file   Years of education: Not on file   Highest education level: Not on file  Occupational History   Not on file  Tobacco Use   Smoking status: Never   Smokeless tobacco: Never  Vaping Use   Vaping status: Never Used  Substance and Sexual Activity   Alcohol use: Yes    Comment: occasionally beer on weekends   Drug use: No   Sexual activity: Not on file  Other Topics Concern   Not on file  Social History Narrative   Lives with wife.  4 children over two marriages.  Retired Airline pilot   Social Determinants of Corporate investment banker Strain: Not on [employer] Insecurity: Low Risk  (03/11/2023)   Received from [employer] vital sign    Within the past 12 months, you worried that your food would run out before you got money to buy more: Never true    Within the past 12 months, the food you bought just didn't last and you didn't have money to get more. : Never true  Transportation Needs: Not on file (03/11/2023)  Physical Activity: Not on file  Stress: Not on file  Social Connections: Not on file  Intimate Partner Violence: Not on file     No Known Allergies   Outpatient Medications Prior to Visit  Medication Sig Dispense Refill   allopurinol (ZYLOPRIM) 300 MG tablet Take 300 mg by mouth daily.     amLODipine (NORVASC) 5 MG tablet Take 5 mg by mouth daily.     atenolol (TENORMIN) 50 MG tablet Take 50 mg by mouth daily.     cetirizine (ZYRTEC) 10 MG tablet Take 10 mg by mouth daily as needed for allergies.     clopidogrel (PLAVIX) 75 MG tablet  Take 75 mg by mouth daily.     hydrochlorothiazide (MICROZIDE) 12.5 MG capsule Take 12.5 mg by mouth daily.     levothyroxine (SYNTHROID) 25 MCG tablet Take 25 mcg by mouth daily before breakfast.     potassium chloride (KLOR-CON) 20 MEQ packet Take 40 mEq by mouth daily. 30 packet 0   sertraline (ZOLOFT) 50 MG tablet Take 50 mg by mouth daily.     HYDROcodone bit-homatropine (HYCODAN) 5-1.5 MG/5ML syrup Take 5 mLs by mouth every 6 (six) hours as needed for cough. 240 mL 0   pantoprazole (PROTONIX) 40 MG tablet Take 1 tablet (40 mg total) by mouth 2 (two) times daily. 60 tablet 1   No facility-administered medications prior to visit.   Review of Systems  Constitutional:  Negative for chills, fever, malaise/fatigue and weight loss.  HENT:  Negative for congestion, sinus pain and sore throat.        Difficulty swallowing  Eyes: Negative.   Respiratory:  Positive for cough and shortness of breath. Negative for hemoptysis, sputum production and wheezing.   Cardiovascular:  Negative for chest pain, palpitations, orthopnea, claudication and leg swelling.  Gastrointestinal:  Negative for abdominal pain, heartburn, nausea and vomiting.       Dysphagia  Genitourinary: Negative.   Musculoskeletal:  Negative for joint pain and myalgias.  Skin:  Negative for rash.  Neurological:  Negative for weakness.  Endo/Heme/Allergies: Negative.   Psychiatric/Behavioral: Negative.      Objective:   Vitals:   04/29/23 1034  BP: 134/76  Pulse: (!) 56  Temp: (!) 97.3 F (36.3 C)  TempSrc: Temporal  SpO2: 99%  Weight: 153 lb (69.4 kg)  Height: 5\' 1"  (1.549 m)     Physical Exam Constitutional:      General: He is not in acute distress. HENT:     Head: Normocephalic and atraumatic.     Salivary Glands: Right salivary gland is not diffusely enlarged. Left salivary gland is not diffusely enlarged.     Mouth/Throat:     Mouth: No oral lesions.     Palate: No mass and lesions.     Tonsils: No  tonsillar exudate.     Comments: Hoarse voice Eyes:     Conjunctiva/sclera: Conjunctivae normal.  Neck:     Comments:  Soft tissue mass along right neck, mobile Cardiovascular:     Rate and Rhythm: Normal rate and regular rhythm.     Pulses: Normal pulses.     Heart sounds: Normal heart sounds. No murmur heard. Pulmonary:     Breath sounds: Rales (Bibasilar) present.  Musculoskeletal:     Right lower leg: No edema.     Left lower leg: No edema.  Skin:    General: Skin is warm and dry.  Neurological:     General: No focal deficit present.     Mental Status: He is alert.    CBC    Component Value Date/Time   WBC 15.7 (H) 07/27/2022 1552   RBC 4.29 07/27/2022 1552   HGB 13.4 07/27/2022 1552   HCT 40.2 07/27/2022 1552   PLT 291 07/27/2022 1552   MCV 93.7 07/27/2022 1552   MCH 31.2 07/27/2022 1552   MCHC 33.3 07/27/2022 1552   RDW 13.3 07/27/2022 1552   LYMPHSABS 1.7 07/27/2022 1552   MONOABS 1.5 (H) 07/27/2022 1552   EOSABS 0.1 07/27/2022 1552   BASOSABS 0.0 07/27/2022 1552      Latest Ref Rng & Units 07/27/2022    3:52 PM 07/23/2022    3:49 PM 06/22/2022    5:22 PM  BMP  Glucose 70 - 99 mg/dL 308  93  93   BUN 8 - 23 mg/dL 37  37  20   Creatinine 0.61 - 1.24 mg/dL 6.57  8.46  9.62   Sodium 135 - 145 mmol/L 138  140  138   Potassium 3.5 - 5.1 mmol/L 2.8  3.6  3.4   Chloride 98 - 111 mmol/L 99  98  96   CO2 22 - 32 mmol/L 28  29  28    Calcium 8.9 - 10.3 mg/dL 9.3  9.8  9.3    Chest imaging: CXR 08/01/22 1. Mild, residual pneumomediastinum and subcutaneous emphysema about the right chest wall, improved compared to prior examination. 2. Fibrotic interstitial opacity in keeping with prior CT findings, without acute appearing airspace opacity.  CT Chest PE Study 06/22/22 Cardiovascular: There are no intraluminal filling defects in pulmonary artery branches. Contrast density in thoracic aorta is less than adequate to evaluate the lumen. Calcifications are seen  in aortic annulus. There is ectasia of ascending thoracic aorta measuring 3.9 cm. Coronary artery calcifications are seen. Heart is enlarged in size.   Mediastinum/Nodes: Subcentimeter nodes are seen in mediastinum. Thyroid is not distinctly visualized.   Lungs/Pleura: Increased interstitial markings are seen with subpleural honeycombing in both lungs suggesting chronic interstitial lung disease, possibly UIP. There is no focal pulmonary consolidation. There is no pleural effusion or pneumothorax.   Upper Abdomen: There is 12 cm cyst in the upper pole of left kidney.  HRCT Chest 08/24/21 1. The appearance of the lungs is considered diagnostic of usual interstitial pneumonia (UIP) per current ATS guidelines. 2. Aortic atherosclerosis, in addition to left main and 3 vessel coronary artery disease. 3. There are calcifications of the aortic valve. Echocardiographic correlation for evaluation of potential valvular dysfunction may be warranted if clinically indicated. 4. Morphologic changes in the liver, suggestive of underlying cirrhosis.  PFT:    Latest Ref Rng & Units 10/12/2021   12:51 PM  PFT Results  FVC-Pre L 2.35   FVC-Predicted Pre % 73   FVC-Post L 2.71   FVC-Predicted Post % 84   Pre FEV1/FVC % % 47   Post FEV1/FCV % % 73   FEV1-Pre L  1.11   FEV1-Predicted Pre % 49   FEV1-Post L 1.97   DLCO uncorrected ml/min/mmHg 11.35   DLCO UNC% % 54   DLCO corrected ml/min/mmHg 11.35   DLCO COR %Predicted % 54   DLVA Predicted % 69   TLC L 4.38   TLC % Predicted % 71   RV % Predicted % 80   2023: Mild restriction, moderate diffusion defect  Labs:  Path:  Echo:  Heart Catheterization:  Assessment & Plan:   Pulmonary fibrosis (HCC)  Dysphagia, unspecified type  Hoarseness of voice  Discussion: Thomas Bond is an 82 year old male, never smoker with GERD, hypertension, dysphagia and history of stroke who returns to pulmonary clinic for pulmonary fibrosis.    He was tried on ofev last year which was discontinued due to weight loss, fatigue and weakness along with diarrhea. He has ongoing dysphagia issues due to concern of cervical osteophytes causing external compression of his pharynx. He has done well with speech therapy recommendations and has been able to eat well enough to gain weight and maintain it.   We will continue to monitor his respiratory symptoms at this time. No further plans to try anti fibrotic medications given his weight issues in the past.   Follow up in 6 months.   Melody Comas, MD [facility] Office: [phone]   Current Outpatient Medications:    allopurinol (ZYLOPRIM) 300 MG tablet, Take 300 mg by mouth daily., Disp: , Rfl:    amLODipine (NORVASC) 5 MG tablet, Take 5 mg by mouth daily., Disp: , Rfl:    atenolol (TENORMIN) 50 MG tablet, Take 50 mg by mouth daily., Disp: , Rfl:    cetirizine (ZYRTEC) 10 MG tablet, Take 10 mg by mouth daily as needed for allergies., Disp: , Rfl:    clopidogrel (PLAVIX) 75 MG tablet, Take 75 mg by mouth daily., Disp: , Rfl:    hydrochlorothiazide (MICROZIDE) 12.5 MG capsule, Take 12.5 mg by mouth daily., Disp: , Rfl:    levothyroxine (SYNTHROID) 25 MCG tablet, Take 25 mcg by mouth daily before breakfast., Disp: , Rfl:    potassium chloride (KLOR-CON) 20 MEQ packet, Take 40 mEq by mouth daily., Disp: 30 packet, Rfl: 0   sertraline (ZOLOFT) 50 MG tablet, Take 50 mg by mouth daily., Disp: , Rfl:    pantoprazole (PROTONIX) 40 MG tablet, Take 1 tablet (40 mg total) by mouth 2 (two) times daily., Disp: 60 tablet, Rfl: 1

## 2023-05-07 ENCOUNTER — Encounter: Payer: Self-pay | Admitting: Pulmonary Disease

## 2023-06-09 ENCOUNTER — Telehealth: Payer: Self-pay | Admitting: Pulmonary Disease

## 2023-06-09 NOTE — Telephone Encounter (Signed)
Patient would like to know if it safe for him to fly. Patient phone number is [phone].

## 2023-06-12 NOTE — Telephone Encounter (Signed)
Yes, safe for patient to fly. He may feel a little more short of breath at higher altitudes, but his oxygen levels should remain safe.  Dr. Francine Graven

## 2023-06-13 NOTE — Telephone Encounter (Signed)
Pt informed. nothing further needed. 

## 2023-06-26 DIAGNOSIS — B029 Zoster without complications: Secondary | ICD-10-CM | POA: Diagnosis not present

## 2023-06-26 DIAGNOSIS — L98499 Non-pressure chronic ulcer of skin of other sites with unspecified severity: Secondary | ICD-10-CM | POA: Diagnosis not present

## 2023-06-26 DIAGNOSIS — L089 Local infection of the skin and subcutaneous tissue, unspecified: Secondary | ICD-10-CM | POA: Diagnosis not present

## 2023-07-07 DIAGNOSIS — L089 Local infection of the skin and subcutaneous tissue, unspecified: Secondary | ICD-10-CM | POA: Diagnosis not present

## 2023-08-22 ENCOUNTER — Telehealth: Payer: Self-pay | Admitting: Pulmonary Disease

## 2023-08-22 NOTE — Telephone Encounter (Signed)
LM per last ov plan was not to repeat PFT, Dr.Dewald will determine repeat next ov.

## 2023-08-22 NOTE — Telephone Encounter (Signed)
Patient would like to know if he needs PFT for February 2025. Visit summary states follow up in 6 months with Dr, Francine Graven. Patient phone number is [phone].

## 2023-08-27 DIAGNOSIS — L821 Other seborrheic keratosis: Secondary | ICD-10-CM | POA: Diagnosis not present

## 2023-08-27 DIAGNOSIS — L111 Transient acantholytic dermatosis [Grover]: Secondary | ICD-10-CM | POA: Diagnosis not present

## 2023-08-27 DIAGNOSIS — B353 Tinea pedis: Secondary | ICD-10-CM | POA: Diagnosis not present

## 2023-08-27 DIAGNOSIS — B351 Tinea unguium: Secondary | ICD-10-CM | POA: Diagnosis not present

## 2023-08-27 DIAGNOSIS — L814 Other melanin hyperpigmentation: Secondary | ICD-10-CM | POA: Diagnosis not present

## 2023-08-27 DIAGNOSIS — D225 Melanocytic nevi of trunk: Secondary | ICD-10-CM | POA: Diagnosis not present

## 2023-08-27 DIAGNOSIS — L853 Xerosis cutis: Secondary | ICD-10-CM | POA: Diagnosis not present

## 2023-09-18 DIAGNOSIS — H5213 Myopia, bilateral: Secondary | ICD-10-CM | POA: Diagnosis not present

## 2023-11-06 ENCOUNTER — Ambulatory Visit: Payer: Medicare HMO | Admitting: Pulmonary Disease

## 2023-11-06 ENCOUNTER — Encounter: Payer: Self-pay | Admitting: Pulmonary Disease

## 2023-11-06 VITALS — BP 163/69 | HR 57 | Ht 66.0 in | Wt 153.0 lb

## 2023-11-06 DIAGNOSIS — R1310 Dysphagia, unspecified: Secondary | ICD-10-CM

## 2023-11-06 DIAGNOSIS — J8410 Pulmonary fibrosis, unspecified: Secondary | ICD-10-CM

## 2023-11-06 DIAGNOSIS — R490 Dysphonia: Secondary | ICD-10-CM

## 2023-11-06 NOTE — Progress Notes (Signed)
Synopsis: Referred in December 2022 for abnormal chest x-ray by Fatima Sanger, FNP  Subjective:   PATIENT ID: Georgetta Haber GENDER: male DOB: 02/19/1941, MRN: [id number]  HPI  Chief Complaint  Patient presents with   Follow-up    Pt states he said he's been having some SOB but does not stop him from doing the usual day by day stuff.   Orestes Geiman is an 83 year old male, never smoker with GERD, hypertension, dysphagia and history of stroke who returns to pulmonary clinic for pulmonary fibrosis.   He has been doing ok since last visit. His weight has been stable. He continues to have dysphagia issues. He is doing his own shopping, cooking and driving. He does have exertional dyspnea. He can work 15-20 minutes in the garden with simple trimming, then needs to take a break. He is having more issues with his voice and talking on the phone.   No cough or wheezing  He did not desaturate on simple walk today.  OV 04/29/23 He was seen 03/11/23 by Dr. Pollyann Kennedy of ENT for his neck masses/cysts. No plans for procedure given risk of general anesthesia needed for removal.  He had drainage of one of these neck masses/cysts due to concern for infection.  He has gained 18lbs since last visit.   No changes in his breathing.   OV October 10, 2022 His wife passed away 2 weeks ago.   He met with IR about having a PEG tube placed with decision made to hold off at this time. His weight is stable compared to last visit.    OV 08/01/22 He had neck CT which showed evidence of pneumomediastinum and was sent to the ER for further evaluation with CT Chest. He was stable at that time and discharged home with close follow.   I discussed patient's case with ENT yesterday and there is concern that the cervical osteophytes could be leading to extrinsic compression on his upper airway with dysphagia and dysphonia. They recommended referral to [facility] main campus for further swallowing evaluation and testing.    He has not had progression of symptoms regarding pneumomediastinum. He continues to have difficulty swallowing. His son is in attendance with him today.   OV 07/23/22 He was seen by Dr. Wynona Neat in acute visit 07/18/22 where he was treated with prednisone and azithromycin for dyspnea, wheezing and cough. He reports the dyspne and wheezing are better. He reports his dysphagia is progressive. He has lost 9lbs since 11/2 due to difficulty eating. He reports progressive changes in his voice that is muffled. Ofev was stopped at last visit in August.  His wife has been in the hospital recovering from covid infection complicated by bacterial pneumonia.   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 Evidence of gastritis s/p biopsies. He denies any significant changes in his ability to swallow or change in his voice.   He has been taking ofev 150 1 tab daily over the past month. He cut back from twice daily. He continues to have queasy stomach and diarrhea. His weight is stable from last visit. He goes back to bed one to two times per day which is new for him.   OV 02/12/22 He was seen by ENT on 01/16/22, note reviewed. No significant findings on flexible laryngoscopy to explain his hoarseness of voice.   He saw Speech Therapy 01/01/22 who recommended updated MBS and ongoing  therapy for pharyngeal strengthening.   He started on ofev 150mg  1 tab twice daily since last visit. He has lost 8lbs since last visit. He has lost 14lbs since the beginning of the year. Since starting ofev he has decreased appetite and is having loose stools 2-3 times per day.   OV 12/21/21 He met with our pharmacy team on 10/17/21 and has not received Esbriet yet.  He had modified barium swallow as well as esophagram performed in January 2023 where he was noted to have the barium tablet lodged in the left piriform sinus.  He continues to have hoarse voice and trouble  swallowing.  He is scheduled to see ENT in May.  Pulmonary function test 10/12/2021 showed mixed obstructive and restrictive defects along with significant bronchodilator response and moderate diffusion defect.  I reviewed his HRCT chest scan findings and pulmonary function test findings with the patient and his wife.  We had detailed discussion about the progression of pulmonary fibrosis and thinking about their advanced directives.  OV 10/12/21 HRCT Chest 08/24/21 shows evidence of usual interstitial pneumonia with widespread areas of ground glass atenuation, septal thickening, subpleural reticulation, traction bronchiectasis and peripheral bronchiolectasis with very mild honeycombing in the lung bases.   PFTs today show mild restriction with moderate diffusion defect.   He did not notice benefit from [facility] ellipta inhaler.   He continues to have hoarseness of voice which is new over recent months. MBS from 2019 noted from office visit 08/17/21 with anterior cervical spine intrusion of pharynx/esophagus.  OV 08/17/21 Patient reports having shortness of breath over the past couple of years.  He has been tried on Asmanex as needed which he did not notice much improvement in his symptoms.  He has issues with dysphagia and has been evaluated by speech therapy multiple times over the past years.  Modified barium swallow from 2019 which shows mild pharyngeal cervical esophageal dysphagia due to mechanical obstruction from anterior cervical spine.  No aspiration observed and trace laryngeal penetration of thin liquids from vallecular residuals spilling into open airway cleared with further swallow.  Epiglottic deflection decreased likely due to cervical spine protrusion into pharynx which contributes to pharyngeal residuals.  He denies any issues with frequent pneumonias over the past years.  He denies any cough but does report wheezing.  He does report hoarseness in his voice which has been more constant  over recent months.  He denies any seasonal allergies.  He denies any sinus congestion or postnasal drainage.  He is a never smoker but is exposed to significant secondhand smoke growing up as his parents smoked in the household.  He worked for a Chiropractor as an Airline pilot and denies any significant exposure to the chemicals.  He complains that his feet feel swollen in the morning time but when he checks them they do not appear swollen.  Past Medical History:  Diagnosis Date   Aortic atherosclerosis (HCC)    GERD (gastroesophageal reflux disease)    Gout    Hypertension    Pulmonary fibrosis (HCC)    Sleep apnea 2013   Not CPAP   TIA      Family History  Problem Relation Age of Onset   Alzheimer's disease Mother    Heart attack Brother 41   Stroke Neg Hx      Social History   Socioeconomic History   Marital status: Married    Spouse name: Not on file   Number of children: Not on file  Years of education: Not on file   Highest education level: Not on file  Occupational History   Not on file  Tobacco Use   Smoking status: Never   Smokeless tobacco: Never  Vaping Use   Vaping status: Never Used  Substance and Sexual Activity   Alcohol use: Yes    Comment: occasionally beer on weekends   Drug use: No   Sexual activity: Not on file  Other Topics Concern   Not on file  Social History Narrative   Lives with wife.  4 children over two marriages.  Retired Airline pilot   Social Drivers of Corporate investment banker Strain: Not on [employer] Insecurity: Low Risk  (03/11/2023)   Received from [facility]   Hunger Vital Sign    Worried About Running Out of Food in the Last Year: Never true    Ran Out of Food in the Last Year: Never true  Transportation Needs: Not on file (03/11/2023)  Physical Activity: Not on file  Stress: Not on file  Social Connections: Not on file  Intimate Partner Violence: Not on file     No Known Allergies   Outpatient Medications  Prior to Visit  Medication Sig Dispense Refill   allopurinol (ZYLOPRIM) 300 MG tablet Take 300 mg by mouth daily.     amLODipine (NORVASC) 5 MG tablet Take 5 mg by mouth daily.     atenolol (TENORMIN) 50 MG tablet Take 50 mg by mouth daily.     cetirizine (ZYRTEC) 10 MG tablet Take 10 mg by mouth daily as needed for allergies.     clopidogrel (PLAVIX) 75 MG tablet Take 75 mg by mouth daily.     hydrochlorothiazide (MICROZIDE) 12.5 MG capsule Take 12.5 mg by mouth daily.     levothyroxine (SYNTHROID) 25 MCG tablet Take 25 mcg by mouth daily before breakfast.     potassium chloride (KLOR-CON) 20 MEQ packet Take 40 mEq by mouth daily. 30 packet 0   sertraline (ZOLOFT) 50 MG tablet Take 50 mg by mouth daily.     pantoprazole (PROTONIX) 40 MG tablet Take 1 tablet (40 mg total) by mouth 2 (two) times daily. 60 tablet 1   No facility-administered medications prior to visit.   Review of Systems  Constitutional:  Negative for chills, fever, malaise/fatigue and weight loss.  HENT:  Negative for congestion, sinus pain and sore throat.        Difficulty swallowing  Eyes: Negative.   Respiratory:  Positive for cough and shortness of breath. Negative for hemoptysis, sputum production and wheezing.   Cardiovascular:  Negative for chest pain, palpitations, orthopnea, claudication and leg swelling.  Gastrointestinal:  Negative for abdominal pain, heartburn, nausea and vomiting.       Dysphagia  Genitourinary: Negative.   Musculoskeletal:  Negative for joint pain and myalgias.  Skin:  Negative for rash.  Neurological:  Negative for weakness.  Endo/Heme/Allergies: Negative.   Psychiatric/Behavioral: Negative.      Objective:   Vitals:   11/06/23 1258  BP: (!) 163/69  Pulse: (!) 57  SpO2: 96%  Weight: 153 lb (69.4 kg)  Height: 5\' 6"  (1.676 m)     Physical Exam Constitutional:      General: He is not in acute distress. HENT:     Head: Normocephalic and atraumatic.     Salivary Glands:  Right salivary gland is not diffusely enlarged. Left salivary gland is not diffusely enlarged.     Mouth/Throat:  Comments: Hoarse voice Eyes:     Conjunctiva/sclera: Conjunctivae normal.  Cardiovascular:     Rate and Rhythm: Normal rate and regular rhythm.     Pulses: Normal pulses.     Heart sounds: Normal heart sounds. No murmur heard. Pulmonary:     Breath sounds: Rales (Bibasilar) present.  Musculoskeletal:     Right lower leg: No edema.     Left lower leg: No edema.  Skin:    General: Skin is warm and dry.  Neurological:     General: No focal deficit present.     Mental Status: He is alert.    CBC    Component Value Date/Time   WBC 15.7 (H) 07/27/2022 1552   RBC 4.29 07/27/2022 1552   HGB 13.4 07/27/2022 1552   HCT 40.2 07/27/2022 1552   PLT 291 07/27/2022 1552   MCV 93.7 07/27/2022 1552   MCH 31.2 07/27/2022 1552   MCHC 33.3 07/27/2022 1552   RDW 13.3 07/27/2022 1552   LYMPHSABS 1.7 07/27/2022 1552   MONOABS 1.5 (H) 07/27/2022 1552   EOSABS 0.1 07/27/2022 1552   BASOSABS 0.0 07/27/2022 1552      Latest Ref Rng & Units 07/27/2022    3:52 PM 07/23/2022    3:49 PM 06/22/2022    5:22 PM  BMP  Glucose 70 - 99 mg/dL 161  93  93   BUN 8 - 23 mg/dL 37  37  20   Creatinine 0.61 - 1.24 mg/dL 0.96  0.45  4.09   Sodium 135 - 145 mmol/L 138  140  138   Potassium 3.5 - 5.1 mmol/L 2.8  3.6  3.4   Chloride 98 - 111 mmol/L 99  98  96   CO2 22 - 32 mmol/L 28  29  28    Calcium 8.9 - 10.3 mg/dL 9.3  9.8  9.3    Chest imaging: CXR 08/01/22 1. Mild, residual pneumomediastinum and subcutaneous emphysema about the right chest wall, improved compared to prior examination. 2. Fibrotic interstitial opacity in keeping with prior CT findings, without acute appearing airspace opacity.  CT Chest PE Study 06/22/22 Cardiovascular: There are no intraluminal filling defects in pulmonary artery branches. Contrast density in thoracic aorta is less than adequate to evaluate the lumen.  Calcifications are seen in aortic annulus. There is ectasia of ascending thoracic aorta measuring 3.9 cm. Coronary artery calcifications are seen. Heart is enlarged in size.   Mediastinum/Nodes: Subcentimeter nodes are seen in mediastinum. Thyroid is not distinctly visualized.   Lungs/Pleura: Increased interstitial markings are seen with subpleural honeycombing in both lungs suggesting chronic interstitial lung disease, possibly UIP. There is no focal pulmonary consolidation. There is no pleural effusion or pneumothorax.   Upper Abdomen: There is 12 cm cyst in the upper pole of left kidney.  HRCT Chest 08/24/21 1. The appearance of the lungs is considered diagnostic of usual interstitial pneumonia (UIP) per current ATS guidelines. 2. Aortic atherosclerosis, in addition to left main and 3 vessel coronary artery disease. 3. There are calcifications of the aortic valve. Echocardiographic correlation for evaluation of potential valvular dysfunction may be warranted if clinically indicated. 4. Morphologic changes in the liver, suggestive of underlying cirrhosis.  PFT:    Latest Ref Rng & Units 10/12/2021   12:51 PM  PFT Results  FVC-Pre L 2.35   FVC-Predicted Pre % 73   FVC-Post L 2.71   FVC-Predicted Post % 84   Pre FEV1/FVC % % 47   Post FEV1/FCV % %  73   FEV1-Pre L 1.11   FEV1-Predicted Pre % 49   FEV1-Post L 1.97   DLCO uncorrected ml/min/mmHg 11.35   DLCO UNC% % 54   DLCO corrected ml/min/mmHg 11.35   DLCO COR %Predicted % 54   DLVA Predicted % 69   TLC L 4.38   TLC % Predicted % 71   RV % Predicted % 80   2023: Mild restriction, moderate diffusion defect  Labs:  Path:  Echo:  Heart Catheterization:  Assessment & Plan:   Pulmonary fibrosis (HCC) - Plan: CT CHEST HIGH RESOLUTION, Pulse oximetry, overnight, CANCELED: Pulse oximetry, overnight  Hoarseness of voice  Dysphagia, unspecified type  Discussion: Shaquil Aldana is an 83 year old male, never  smoker with GERD, hypertension, dysphagia and history of stroke who returns to pulmonary clinic for pulmonary fibrosis.   He was tried on ofev previously which was discontinued due to weight loss, fatigue and weakness along with diarrhea. He has ongoing dysphagia issues due to concern of cervical osteophytes causing external compression of his pharynx. His weight has remained stable.   We will continue to monitor his respiratory symptoms at this time. No further plans to try anti fibrotic medications given his weight issues in the past.   Repeat CT chest for monitoring and check overnight oxygen test. His simple walk test shows no need for ambulatory oxygen at this time.  Follow up in 6 months.   Melody Comas, MD [facility] Office: [phone]   Current Outpatient Medications:    allopurinol (ZYLOPRIM) 300 MG tablet, Take 300 mg by mouth daily., Disp: , Rfl:    amLODipine (NORVASC) 5 MG tablet, Take 5 mg by mouth daily., Disp: , Rfl:    atenolol (TENORMIN) 50 MG tablet, Take 50 mg by mouth daily., Disp: , Rfl:    cetirizine (ZYRTEC) 10 MG tablet, Take 10 mg by mouth daily as needed for allergies., Disp: , Rfl:    clopidogrel (PLAVIX) 75 MG tablet, Take 75 mg by mouth daily., Disp: , Rfl:    hydrochlorothiazide (MICROZIDE) 12.5 MG capsule, Take 12.5 mg by mouth daily., Disp: , Rfl:    levothyroxine (SYNTHROID) 25 MCG tablet, Take 25 mcg by mouth daily before breakfast., Disp: , Rfl:    potassium chloride (KLOR-CON) 20 MEQ packet, Take 40 mEq by mouth daily., Disp: 30 packet, Rfl: 0   sertraline (ZOLOFT) 50 MG tablet, Take 50 mg by mouth daily., Disp: , Rfl:    pantoprazole (PROTONIX) 40 MG tablet, Take 1 tablet (40 mg total) by mouth 2 (two) times daily., Disp: 60 tablet, Rfl: 1

## 2023-11-06 NOTE — Patient Instructions (Addendum)
We will schedule you for a a CT Chest scan to monitor your pulmonary fibrosis  We will schedule you for overnight oxygen testing  We will walk you in clinic today to check your oxygen levels  Follow up in 6 months, call sooner if needed

## 2023-11-14 DIAGNOSIS — R0902 Hypoxemia: Secondary | ICD-10-CM | POA: Diagnosis not present

## 2023-11-14 DIAGNOSIS — G4730 Sleep apnea, unspecified: Secondary | ICD-10-CM | POA: Diagnosis not present

## 2023-11-28 ENCOUNTER — Telehealth: Payer: Self-pay | Admitting: Pulmonary Disease

## 2023-11-28 NOTE — Telephone Encounter (Signed)
 I called and spoke to pt. Pt informed of results and verbalized understanding. NFN

## 2023-11-28 NOTE — Telephone Encounter (Signed)
 ONO results  Please let patient know the following.   Patient spent 28 sec with SpO2 88% or less. He does not qualify for night time oxygen.  Dr. Francine Graven

## 2023-12-03 ENCOUNTER — Encounter: Payer: Self-pay | Admitting: Pulmonary Disease

## 2023-12-15 DIAGNOSIS — R1310 Dysphagia, unspecified: Secondary | ICD-10-CM | POA: Diagnosis not present

## 2023-12-15 DIAGNOSIS — R490 Dysphonia: Secondary | ICD-10-CM | POA: Diagnosis not present

## 2023-12-15 DIAGNOSIS — R221 Localized swelling, mass and lump, neck: Secondary | ICD-10-CM | POA: Diagnosis not present

## 2024-01-02 DIAGNOSIS — R1310 Dysphagia, unspecified: Secondary | ICD-10-CM | POA: Diagnosis not present

## 2024-01-02 DIAGNOSIS — Z974 Presence of external hearing-aid: Secondary | ICD-10-CM | POA: Diagnosis not present

## 2024-01-02 DIAGNOSIS — R2689 Other abnormalities of gait and mobility: Secondary | ICD-10-CM | POA: Diagnosis not present

## 2024-01-02 DIAGNOSIS — H6122 Impacted cerumen, left ear: Secondary | ICD-10-CM | POA: Diagnosis not present

## 2024-02-26 DIAGNOSIS — R42 Dizziness and giddiness: Secondary | ICD-10-CM | POA: Diagnosis not present

## 2024-02-26 DIAGNOSIS — R269 Unspecified abnormalities of gait and mobility: Secondary | ICD-10-CM | POA: Diagnosis not present

## 2024-03-05 DIAGNOSIS — R42 Dizziness and giddiness: Secondary | ICD-10-CM | POA: Diagnosis not present

## 2024-03-05 DIAGNOSIS — R269 Unspecified abnormalities of gait and mobility: Secondary | ICD-10-CM | POA: Diagnosis not present

## 2024-03-08 DIAGNOSIS — R269 Unspecified abnormalities of gait and mobility: Secondary | ICD-10-CM | POA: Diagnosis not present

## 2024-03-08 DIAGNOSIS — R42 Dizziness and giddiness: Secondary | ICD-10-CM | POA: Diagnosis not present

## 2024-03-26 ENCOUNTER — Ambulatory Visit (HOSPITAL_COMMUNITY)
Admission: RE | Admit: 2024-03-26 | Discharge: 2024-03-26 | Disposition: A | Discharge status: Home / Self Care | Source: Ambulatory Visit | Attending: Pulmonary Disease | Admitting: Pulmonary Disease

## 2024-03-26 DIAGNOSIS — J8410 Pulmonary fibrosis, unspecified: Secondary | ICD-10-CM | POA: Insufficient documentation

## 2024-03-26 DIAGNOSIS — J982 Interstitial emphysema: Secondary | ICD-10-CM | POA: Diagnosis not present

## 2024-03-26 DIAGNOSIS — R918 Other nonspecific abnormal finding of lung field: Secondary | ICD-10-CM | POA: Diagnosis not present

## 2024-03-26 DIAGNOSIS — J479 Bronchiectasis, uncomplicated: Secondary | ICD-10-CM | POA: Diagnosis not present

## 2024-03-29 DIAGNOSIS — R269 Unspecified abnormalities of gait and mobility: Secondary | ICD-10-CM | POA: Diagnosis not present

## 2024-03-29 DIAGNOSIS — R42 Dizziness and giddiness: Secondary | ICD-10-CM | POA: Diagnosis not present

## 2024-04-02 ENCOUNTER — Telehealth (HOSPITAL_BASED_OUTPATIENT_CLINIC_OR_DEPARTMENT_OTHER): Payer: Self-pay

## 2024-04-02 DIAGNOSIS — R269 Unspecified abnormalities of gait and mobility: Secondary | ICD-10-CM | POA: Diagnosis not present

## 2024-04-02 DIAGNOSIS — R42 Dizziness and giddiness: Secondary | ICD-10-CM | POA: Diagnosis not present

## 2024-04-02 NOTE — Telephone Encounter (Signed)
 Please let patient know that his recent CT Chest scan does not show any significant progression in the scarring of his lungs compared to the last CT Chest scan in 2023.   Thanks, JD

## 2024-04-02 NOTE — Telephone Encounter (Signed)
 Copied from CRM [id number]. Topic: Clinical - Lab/Test Results >> Apr 02, 2024 10:30 AM Corean SAUNDERS wrote: Reason for CRM: Patient is requesting a call back from Dr. Kara and or nurse as he is eager to hear his CT scan results from 7/11 - Patient states that if he doesn't answer the clinic can leave a voicemail.

## 2024-04-05 DIAGNOSIS — R42 Dizziness and giddiness: Secondary | ICD-10-CM | POA: Diagnosis not present

## 2024-04-05 DIAGNOSIS — R269 Unspecified abnormalities of gait and mobility: Secondary | ICD-10-CM | POA: Diagnosis not present

## 2024-04-06 NOTE — Telephone Encounter (Signed)
 Spoke with patient verblized understandfing    NFN-

## 2024-04-09 DIAGNOSIS — R42 Dizziness and giddiness: Secondary | ICD-10-CM | POA: Diagnosis not present

## 2024-04-09 DIAGNOSIS — R269 Unspecified abnormalities of gait and mobility: Secondary | ICD-10-CM | POA: Diagnosis not present

## 2024-04-14 DIAGNOSIS — K219 Gastro-esophageal reflux disease without esophagitis: Secondary | ICD-10-CM | POA: Diagnosis not present

## 2024-04-14 DIAGNOSIS — J8410 Pulmonary fibrosis, unspecified: Secondary | ICD-10-CM | POA: Diagnosis not present

## 2024-04-14 DIAGNOSIS — E039 Hypothyroidism, unspecified: Secondary | ICD-10-CM | POA: Diagnosis not present

## 2024-04-14 DIAGNOSIS — M109 Gout, unspecified: Secondary | ICD-10-CM | POA: Diagnosis not present

## 2024-06-16 ENCOUNTER — Telehealth: Payer: Self-pay

## 2024-06-16 NOTE — Telephone Encounter (Signed)
 Copied from CRM #[id number]. Topic: Medical Record Request - Records Request >> Jun 14, 2024 11:37 AM Leila BROCKS wrote: Reason for CRM: Patient [phone] has an appointment with ENT appointment  this Friday and asking if Dr. Kara can send a copy of the xray of throat earlier this year 2025 to an email [email or web address]. Patient states does not have Mychart. Patient has moved to [place] last month, and new address is [address], [place], [place], [place]; updated in demographics. Please advise and call back.    Spoke w/ patient unfortunately we can not send anything to private emails,  he will have to call Medical records at [phone]   VBU.   -NFN

## 2024-08-31 ENCOUNTER — Encounter (HOSPITAL_BASED_OUTPATIENT_CLINIC_OR_DEPARTMENT_OTHER): Payer: Self-pay
